# Patient Record
Sex: Female | Born: 1975
Health system: Southern US, Community
[De-identification: ages and names within clinical notes are randomized; demographics above are authoritative.]

## PROBLEM LIST (undated history)

## (undated) ENCOUNTER — Inpatient Hospital Stay (HOSPITAL_COMMUNITY): Payer: Self-pay

## (undated) DIAGNOSIS — L409 Psoriasis, unspecified: Secondary | ICD-10-CM

## (undated) DIAGNOSIS — R87619 Unspecified abnormal cytological findings in specimens from cervix uteri: Secondary | ICD-10-CM

## (undated) DIAGNOSIS — T4145XA Adverse effect of unspecified anesthetic, initial encounter: Secondary | ICD-10-CM

## (undated) DIAGNOSIS — N200 Calculus of kidney: Secondary | ICD-10-CM

## (undated) DIAGNOSIS — N611 Abscess of the breast and nipple: Secondary | ICD-10-CM

## (undated) DIAGNOSIS — T8859XA Other complications of anesthesia, initial encounter: Secondary | ICD-10-CM

## (undated) DIAGNOSIS — B977 Papillomavirus as the cause of diseases classified elsewhere: Secondary | ICD-10-CM

## (undated) DIAGNOSIS — I10 Essential (primary) hypertension: Secondary | ICD-10-CM

## (undated) DIAGNOSIS — IMO0002 Reserved for concepts with insufficient information to code with codable children: Secondary | ICD-10-CM

## (undated) HISTORY — PX: BREAST CYST ASPIRATION: SHX578

## (undated) HISTORY — DX: Essential (primary) hypertension: I10

## (undated) HISTORY — PX: DILATION AND CURETTAGE OF UTERUS: SHX78

## (undated) HISTORY — PX: LEEP: SHX91

## (undated) HISTORY — DX: Abscess of the breast and nipple: N61.1

---

## 1997-03-12 ENCOUNTER — Encounter: Admission: RE | Admit: 1997-03-12 | Discharge: 1997-05-22 | Payer: Self-pay | Admitting: Obstetrics and Gynecology

## 1998-09-16 ENCOUNTER — Other Ambulatory Visit: Admission: RE | Admit: 1998-09-16 | Discharge: 1998-09-16 | Payer: Self-pay | Admitting: Obstetrics and Gynecology

## 1998-10-11 ENCOUNTER — Emergency Department (HOSPITAL_COMMUNITY): Admission: EM | Admit: 1998-10-11 | Discharge: 1998-10-11 | Payer: Self-pay | Admitting: Emergency Medicine

## 1999-08-14 ENCOUNTER — Emergency Department (HOSPITAL_COMMUNITY): Admission: EM | Admit: 1999-08-14 | Discharge: 1999-08-14 | Payer: Self-pay | Admitting: Podiatry

## 1999-09-29 ENCOUNTER — Emergency Department (HOSPITAL_COMMUNITY): Admission: EM | Admit: 1999-09-29 | Discharge: 1999-09-29 | Payer: Self-pay | Admitting: Emergency Medicine

## 1999-11-03 ENCOUNTER — Emergency Department (HOSPITAL_COMMUNITY): Admission: EM | Admit: 1999-11-03 | Discharge: 1999-11-03 | Payer: Self-pay | Admitting: Emergency Medicine

## 2000-03-24 ENCOUNTER — Other Ambulatory Visit: Admission: RE | Admit: 2000-03-24 | Discharge: 2000-03-24 | Payer: Self-pay | Admitting: Obstetrics and Gynecology

## 2001-03-12 HISTORY — PX: LAPAROSCOPY: SHX197

## 2001-03-28 ENCOUNTER — Encounter (INDEPENDENT_AMBULATORY_CARE_PROVIDER_SITE_OTHER): Payer: Self-pay | Admitting: Specialist

## 2001-03-28 ENCOUNTER — Ambulatory Visit (HOSPITAL_COMMUNITY): Admission: RE | Admit: 2001-03-28 | Discharge: 2001-03-28 | Payer: Self-pay | Admitting: Obstetrics and Gynecology

## 2001-04-28 ENCOUNTER — Encounter (INDEPENDENT_AMBULATORY_CARE_PROVIDER_SITE_OTHER): Payer: Self-pay | Admitting: Specialist

## 2001-04-28 ENCOUNTER — Ambulatory Visit (HOSPITAL_COMMUNITY): Admission: RE | Admit: 2001-04-28 | Discharge: 2001-04-28 | Payer: Self-pay | Admitting: Obstetrics and Gynecology

## 2001-06-24 ENCOUNTER — Encounter: Admission: RE | Admit: 2001-06-24 | Discharge: 2001-06-24 | Payer: Self-pay | Admitting: Gastroenterology

## 2001-06-24 ENCOUNTER — Encounter: Payer: Self-pay | Admitting: Gastroenterology

## 2002-11-27 ENCOUNTER — Emergency Department (HOSPITAL_COMMUNITY): Admission: AD | Admit: 2002-11-27 | Discharge: 2002-11-27 | Payer: Self-pay | Admitting: Family Medicine

## 2004-12-22 ENCOUNTER — Emergency Department (HOSPITAL_COMMUNITY): Admission: EM | Admit: 2004-12-22 | Discharge: 2004-12-22 | Payer: Self-pay | Admitting: Family Medicine

## 2005-05-21 ENCOUNTER — Emergency Department (HOSPITAL_COMMUNITY): Admission: EM | Admit: 2005-05-21 | Discharge: 2005-05-21 | Payer: Self-pay | Admitting: Family Medicine

## 2008-02-10 DIAGNOSIS — B977 Papillomavirus as the cause of diseases classified elsewhere: Secondary | ICD-10-CM

## 2008-02-10 HISTORY — PX: LEEP: SHX91

## 2008-02-10 HISTORY — DX: Papillomavirus as the cause of diseases classified elsewhere: B97.7

## 2009-11-04 ENCOUNTER — Encounter: Admission: RE | Admit: 2009-11-04 | Discharge: 2009-11-04 | Payer: Self-pay | Admitting: Obstetrics and Gynecology

## 2010-03-02 ENCOUNTER — Encounter: Payer: Self-pay | Admitting: Obstetrics and Gynecology

## 2010-06-27 NOTE — Op Note (Signed)
[facility]  Patient:    Christina Weber, Christina Weber Visit Number: [id number] MRN: [id number]          Service Type: DSU Location: [facility] Attending Physician:  Osborn Coho Dictated by:   Janeece Riggers Dareen Piano, M.D. Proc. Date: 03/28/01 Admit Date:  03/28/2001                             Operative Report  PREOPERATIVE DIAGNOSES:       1. Menorrhagia.                               2. Dyspareunia.                               3. Dysmenorrhea.                               4. Pelvic pain.  POSTOPERATIVE DIAGNOSES:      1. Menorrhagia.                               2. Dyspareunia.                               3. Dysmenorrhea.                               4. Pelvic pain.  OPERATION:                    1. Dilation and curettage.                               2. Diagnostic laparoscopy.                               3. Lysis of adhesions.  SURGEON:                      Mark E. Dareen Piano, M.D.  ASSISTANT:  ANESTHESIA:                   General endotracheal anesthesia.  ANTIBIOTICS:                  Ancef 1 g.  DRAINS:                       Red rubber catheter to bladder.  ESTIMATED BLOOD LOSS:         Minimal.  COMPLICATIONS:                None.  SPECIMENS:                    Endometrial and endocervical curettings sent                               to pathology.  FINDINGS:  The patient had normal-appearing stomach, liver, and gallbladder.  The patient had no evidence of endometriosis.  The uterus and fallopian tubes were normal. The ovaries were normal bilaterally. There was a follicular cyst on the left ovary. The patient had no evidence of endometriosis or adhesions in the posterior cul-de-sac. Colon appeared to be normal. There were adhesions between the ileum and the right lower quadrant anterior and lateral abdominal wall.  These were taken down with sharp dissection.  DESCRIPTION OF PROCEDURE:     The patient was  taken to the operating room where she was placed in the dorsal supine position.  A general anesthetic was administered without complications.  She was then placed in dorsal lithotomy position after prep with Hibiclens.  Her bladder was drained with a red rubber catheter. She was draped in the usual fashion for this procedure.  A sterile speculum was placed in the vagina.  Single-tooth tenaculum was applied to the anterior cervical lip.  An endocervical curettage was then performed. The uterus was sounded to 8 cm.  Next, endometrial curettage was performed with copious amounts of tissue being obtained.  No abnormalities were palpated during this procedure.  At this point, the Hulka tenaculum was applied to the anterior cervical lip. The umbilicus was then injected with 0.25% Marcaine, a vertical skin incision was made through the previous scar.  The Verres needle was placed in the peritoneal cavity. An adequate pressure reading was not able to be obtained and therefore an open laparoscopy was performed.  At this point, the fascia was grasped with the Allis clamps and the fascia incised with the Mayo scissors.  The parietoperitoneum was entered with blunt dissection. The suture was then placed in the left and right margin and the Hasson cannula placed in the abdominal cavity. Three liters of carbon dioxide was then insufflated. The scope was then placed and examination of the abdominal and pelvic contents undertaken.  Findings are as mentioned above. At this point,  the adhesions in the right lower quadrant were taken down with sharp dissection.  This concluded the procedure. The instruments were then removed.  The pneumoperitoneum released and the fascia closed with 0 Vicryl sutures.  The skin closed with interrupted 4-0 Vicryl suture. The patient tolerated the procedure well.  She was discharged to home.  She was instructed to follow up in the office in four weeks.  The patient was given  Tylox for pain. Dictated by:   Janeece Riggers Dareen Piano, M.D. Attending Physician:  Osborn Coho DD:  03/28/01 TD:  03/28/01 Job: [id number]

## 2010-06-27 NOTE — Op Note (Signed)
[facility]  Patient:    Christina Weber, Christina Weber Visit Number: [id number] MRN: [id number]          Service Type: DSU Location: [facility] Attending Physician:  Osborn Coho Dictated by:   Janeece Riggers Dareen Piano, M.D. Proc. Date: 04/28/01 Admit Date:  04/28/2001 Discharge Date: 04/28/2001                             Operative Report  wPREOPERATIVE DIAGNOSES:       1. Umbilical pain.                               2. Umbilical mass.                               3. Left lower quadrant pain.  POSTOPERATIVE DIAGNOSES:      1. Umbilical pain.                               2. Umbilical mass.                               3. Left lower quadrant pain.  OPERATION:                    1. Exploration of umbilical incision.                               2. Removal of suture granuloma.                               3. Diagnostic laparoscopy.  SURGEON:                      Mark E. Dareen Piano, M.D.  ANESTHESIA:                   General endotracheal.  ANTIBIOTICS:                  Ancef, 1 gm.  COMPLICATIONS:                None.  SPECIMENS:                    Umbilical mass, sent to pathology.  ESTIMATED BLOOD LOSS:         Minimal.  COMPLICATIONS:                None.  DESCRIPTION OF PROCEDURE:     The patient was taken to the operating room where she was placed in the dorsal supine position. A general anesthetic was administered without complications.  She was then placed in the dorsolithotomy position.  Her bladder was drained with a red rubber catheter, and she was draped in the usual fashion.  A vertical umbilical incision was then made. This was carried down to where the mass was palpated.  The mass was approximately 1 x 2 cm.  It was firm.  The mass was excised and appeared to be a suture granuloma.  There was no evidence of any hernia.  There was no evidence of  any bowel stuck to the anterior abdominal wall.  There was no evidence of any  endometriosis.  At this point, sutures were placed laterally with 0 Monocryl suture, and the Hasson cannula was placed into the abdominal cavity.  The abdominal cavity was then insufflated with 3 liters of carbon dioxide.  There was no evidence of any adhesions in the left lower quadrant. The uterus appeared to be normal. Fallopian tubes and ovaries were normal bilaterally.  The liver appeared to be normal. The gallbladder was not visualized.  No etiology for any pelvic pain or left lower quadrant pain could be identified.  It was felt that the patient may have irritable bowel syndrome.  At this point, the camera was removed, the pneumoperitoneum released, and the fascia closed with 2-0 Monocryl in a running fashion.  The subcuticular tissue was closed with interrupted 4-0 Rapide suture and the skin incision closed with interrupted 4-0 Rapide suture.  The patient will be discharged to home.  She will be sent home with Keflex 500 mg q.i.d. for 3 days and told to follow up in the office in four weeks. She will also be sent home with Percocet to take p.r.n. Dictated by:   Janeece Riggers Dareen Piano, M.D. Attending Physician:  Osborn Coho DD:  04/28/01 TD:  05/03/01 Job: [id number]

## 2011-09-25 ENCOUNTER — Ambulatory Visit (INDEPENDENT_AMBULATORY_CARE_PROVIDER_SITE_OTHER): Payer: Managed Care, Other (non HMO) | Admitting: Family Medicine

## 2011-09-25 VITALS — BP 108/71 | HR 68 | Temp 98.1°F | Resp 16 | Ht 62.5 in | Wt 145.0 lb

## 2011-09-25 DIAGNOSIS — Z0000 Encounter for general adult medical examination without abnormal findings: Secondary | ICD-10-CM

## 2011-09-25 NOTE — Progress Notes (Signed)
[facility] [address] [place] [address] [phone]  Date:  09/25/2011   Name:  Christina Weber   DOB:  1975/10/05   MRN:  [id number]  PCP:  No primary provider on file.    Chief Complaint: Annual Exam   History of Present Illness:  Christina Weber is a 36 y.o. very pleasant female patient who presents with the following:  Here today for her annual PE- her GYN care is done by her gynecologist.  Actually, her OBG does all of her labs as well.  She needs to be seen today because her annual health maintenance incentive is coming due at her job.  She is going to get back in shape- she usually runs but has been too busy lately.  She has 3 children, all teens.  She is separated, does not smoke or use drugs and drinks only occasionally.   There is no problem list on file for this patient.   No past medical history on file.  No past surgical history on file.  History  Substance Use Topics  . Smoking status: Former Smoker    Quit date: 09/24/2008  . Smokeless tobacco: Not on file  . Alcohol Use: Not on file    No family history on file.  No Known Allergies  Medication list has been reviewed and updated.  Current Outpatient Prescriptions on File Prior to Visit  Medication Sig Dispense Refill  . Calcium Carbonate-Vitamin D (CALCIUM + D PO) Take by mouth.        Review of Systems:  As per HPI- otherwise negative.   Physical Examination: Filed Vitals:   09/25/11 1508  BP: 108/71  Pulse: 68  Temp: 98.1 F (36.7 C)  Resp: 16   Filed Vitals:   09/25/11 1508  Height: 5' 2.5" (1.588 m)  Weight: 145 lb (65.772 kg)   Body mass index is 26.10 kg/(m^2). Ideal Body Weight: Weight in (lb) to have BMI = 25: 138.6   GEN: WDWN, NAD, Non-toxic, A & O x 3, slightly overweight HEENT: Atraumatic, Normocephalic. Neck supple. No masses, No LAD.  TM and oropharynx wnl, PEERL, EOMI Ears and Nose: No external deformity. CV: RRR, No M/G/R. No JVD. No  thrill. No extra heart sounds. PULM: CTA B, no wheezes, crackles, rhonchi. No retractions. No resp. distress. No accessory muscle use. ABD: S, NT, ND, +BS. No rebound. No HSM. EXTR: No c/c/e NEURO Normal gait.  PSYCH: Normally interactive. Conversant. Not depressed or anxious appearing.  Calm demeanor.    Assessment and Plan: 1. Physical exam, routine    Completed her PE.  She does not need any labs, immunizations or other services today.    Abbe Amsterdam, MD

## 2012-02-10 NOTE — L&D Delivery Note (Signed)
Delivery Note  SVD viable female Apgars 8,9 over intact perineum.  Placenta delivered spontaneously intact with 3VC. Good support and hemostasis noted.  PH art was sent.  [place] cord blood was not available.  Mother and baby were doing well.  EBL 300cc  Candice Camp, MD

## 2012-02-23 ENCOUNTER — Other Ambulatory Visit: Payer: Self-pay | Admitting: Obstetrics and Gynecology

## 2012-02-23 DIAGNOSIS — N6009 Solitary cyst of unspecified breast: Secondary | ICD-10-CM

## 2012-06-29 LAB — OB RESULTS CONSOLE ABO/RH: RH Type: POSITIVE

## 2012-06-29 LAB — OB RESULTS CONSOLE GC/CHLAMYDIA
Chlamydia: NEGATIVE
Gonorrhea: NEGATIVE

## 2012-06-29 LAB — OB RESULTS CONSOLE HIV ANTIBODY (ROUTINE TESTING): HIV: NONREACTIVE

## 2012-06-29 LAB — OB RESULTS CONSOLE ANTIBODY SCREEN: Antibody Screen: NEGATIVE

## 2012-06-29 LAB — OB RESULTS CONSOLE HEPATITIS B SURFACE ANTIGEN: Hepatitis B Surface Ag: NEGATIVE

## 2012-06-29 LAB — OB RESULTS CONSOLE RPR: RPR: NONREACTIVE

## 2013-01-13 ENCOUNTER — Inpatient Hospital Stay (HOSPITAL_COMMUNITY)
Admission: AD | Admit: 2013-01-13 | Discharge: 2013-01-14 | Disposition: A | Discharge status: Home / Self Care | Payer: BC Managed Care – PPO | Source: Ambulatory Visit | Attending: Obstetrics and Gynecology | Admitting: Obstetrics and Gynecology

## 2013-01-13 DIAGNOSIS — O479 False labor, unspecified: Secondary | ICD-10-CM

## 2013-01-13 DIAGNOSIS — Y92009 Unspecified place in unspecified non-institutional (private) residence as the place of occurrence of the external cause: Secondary | ICD-10-CM | POA: Insufficient documentation

## 2013-01-13 DIAGNOSIS — W19XXXA Unspecified fall, initial encounter: Secondary | ICD-10-CM

## 2013-01-13 DIAGNOSIS — W108XXA Fall (on) (from) other stairs and steps, initial encounter: Secondary | ICD-10-CM | POA: Insufficient documentation

## 2013-01-13 HISTORY — DX: Calculus of kidney: N20.0

## 2013-01-14 ENCOUNTER — Encounter (HOSPITAL_COMMUNITY): Payer: Self-pay | Admitting: *Deleted

## 2013-01-14 LAB — AMNISURE RUPTURE OF MEMBRANE (ROM) NOT AT ARMC: Amnisure ROM: NEGATIVE

## 2013-01-14 NOTE — MAU Provider Note (Signed)
History     CSN: [id number]  Arrival date and time: 01/13/13 2349   First Quirino Kakos Initiated Contact with Patient 01/14/13 0101      Chief Complaint  Patient presents with  . Fall  . Contractions   HPI  Ms. Christina Weber is a 37 y.o. female [phone] at [redacted]w[redacted]d who presents to MAU with complaints of contractions and a fall that occurred at 2330 in her home. She was coming down her steps and slipped and fell onto her bottom, scraped her backside as well. She did not land on her abdomen. The contractions started prior to the fall, in fact she has been contracting all day. At the time of the fall she experienced one gush of fluid; she was unsure if she urinated on herself. She did not continue to leak throughout the day.  She reports good fetal movement, denies LOF currently, vaginal bleeding, vaginal itching/burning, urinary symptoms, h/a, dizziness, n/v, or fever/chills.    OB History   Grav Para Term Preterm Abortions TAB SAB Ect Mult Living   6 3 1 2 2 1  1  3       Past Medical History  Diagnosis Date  . Kidney stone     Past Surgical History  Procedure Laterality Date  . Laparoscopy  03/2001    Family History  Problem Relation Age of Onset  . Cancer Mother   . Hypertension Father   . Diabetes Paternal Grandmother   . Hypertension Paternal Grandmother     History  Substance Use Topics  . Smoking status: Former Smoker    Quit date: 09/24/2008  . Smokeless tobacco: Not on file  . Alcohol Use: No    Allergies: No Known Allergies  Prescriptions prior to admission  Medication Sig Dispense Refill  . B Complex Vitamins (B-COMPLEX/B-12 PO) Take by mouth.      . Calcium Carbonate-Vitamin D (CALCIUM + D PO) Take by mouth.      . fish oil-omega-3 fatty acids 1000 MG capsule Take 500 mg by mouth daily.      . Multiple Vitamins-Minerals (MULTIVITAMIN WITH MINERALS) tablet Take 1 tablet by mouth daily.       Results for orders placed during the hospital encounter of  01/13/13 (from the past 24 hour(s))  AMNISURE RUPTURE OF MEMBRANE (ROM)     Status: None   Collection Time    01/14/13 12:35 AM      Result Value Range   Amnisure ROM NEGATIVE      Review of Systems  Constitutional: Negative for fever and chills.  Gastrointestinal: Positive for abdominal pain. Negative for nausea, vomiting, diarrhea and constipation.       + contraction pain   Genitourinary: Negative for dysuria, urgency, frequency and hematuria.       No vaginal discharge. No vaginal bleeding. No dysuria.   Musculoskeletal: Positive for back pain.   Physical Exam   Blood pressure 119/80, pulse 91, temperature 98.4 F (36.9 C), resp. rate 20, height 5' 2.5" (1.588 m), weight 82.918 kg (182 lb 12.8 oz), unknown if currently breastfeeding.  Physical Exam  Constitutional: She is oriented to person, place, and time. She appears well-developed and well-nourished. No distress.  HENT:  Head: Normocephalic.  Eyes: Pupils are equal, round, and reactive to light.  Neck: Neck supple.  Respiratory: Effort normal.  GI: Soft. She exhibits no distension. There is no tenderness. There is no rebound and no guarding.  Abdomen is soft between contractions   Musculoskeletal: Normal  range of motion.  Neurological: She is alert and oriented to person, place, and time.  Skin: Skin is warm. She is not diaphoretic.  Psychiatric: Her behavior is normal.   Dilation: 2.5 Effacement (%): 60 Cervical Position: Posterior Station: -3 Presentation: Vertex Exam by:: D Simpson RN   Cervix rechecked at 0145 Dilation: 2.5 Effacement (%): 60 Cervical Position: Posterior Station: -3 Presentation: Vertex Exam by:: D Simpson RN  Fetal Tracing: Baseline: 135 bpm Variability: Moderate- Marked  Accelerations: 15x15 Decelerations: none  Toco: Irregular contraction pattern    MAU Course  Procedures None  MDM NST Amnisure negative  Consulted with Dr. Vincente Poli regarding fall and plan of care.  Reactive fetal tracing, amnisure negative. Plan to recheck patient and discharge home RN to recheck patients cervix prior to discharge home  Assessment and Plan   A: 1. Fall at home, initial encounter   2. Braxton Hick's contraction     P: Discharge home Discussed warning signs to return for Kick counts discussed Labor precautions discussed Return with any vaginal bleeding or leaking or fluid  RASCH, JENNIFER IRENE NP 01/14/2013, 6:49 AM

## 2013-01-14 NOTE — MAU Note (Signed)
I've been having contractions all day. 2.5cm this am.. Was coming down steps about ago and fell down last 2 steps. Bumped down the 2 steps on bottom and scraped lower back (hardwood). I leaked some afterward and don't think i have leaked any since.

## 2013-01-25 ENCOUNTER — Inpatient Hospital Stay (HOSPITAL_COMMUNITY)
Admission: AD | Admit: 2013-01-25 | Discharge: 2013-01-26 | Disposition: A | Discharge status: Home / Self Care | Payer: BC Managed Care – PPO | Source: Ambulatory Visit | Attending: Obstetrics and Gynecology | Admitting: Obstetrics and Gynecology

## 2013-01-25 DIAGNOSIS — O4690 Antepartum hemorrhage, unspecified, unspecified trimester: Secondary | ICD-10-CM | POA: Insufficient documentation

## 2013-01-25 DIAGNOSIS — O479 False labor, unspecified: Secondary | ICD-10-CM | POA: Insufficient documentation

## 2013-01-25 LAB — OB RESULTS CONSOLE GBS: GBS: POSITIVE

## 2013-01-25 NOTE — MAU Note (Signed)
Contractions every 3-5 mins tonight, getting more painful. Denies LOF. Positive fetal movement. Vaginal bleeding tonight, states started out at spotting but now heavier. Was dilated 4 cm last week.

## 2013-01-25 NOTE — MAU Note (Addendum)
Having ctxs 3-5 min apart, was 4 cm at office, had sm amt of bright red blood earlier.  None on pad at present.

## 2013-01-26 ENCOUNTER — Encounter (HOSPITAL_COMMUNITY): Payer: BC Managed Care – PPO | Admitting: Anesthesiology

## 2013-01-26 ENCOUNTER — Inpatient Hospital Stay (HOSPITAL_COMMUNITY)
Admission: AD | Admit: 2013-01-26 | Discharge: 2013-01-28 | DRG: 775 | Disposition: A | Discharge status: Home / Self Care | Payer: BC Managed Care – PPO | Source: Ambulatory Visit | Attending: Obstetrics and Gynecology | Admitting: Obstetrics and Gynecology

## 2013-01-26 ENCOUNTER — Encounter (HOSPITAL_COMMUNITY): Payer: Self-pay

## 2013-01-26 ENCOUNTER — Encounter (HOSPITAL_COMMUNITY): Payer: Self-pay | Admitting: General Practice

## 2013-01-26 ENCOUNTER — Inpatient Hospital Stay (HOSPITAL_COMMUNITY): Payer: BC Managed Care – PPO | Admitting: Anesthesiology

## 2013-01-26 DIAGNOSIS — Z22330 Carrier of Group B streptococcus: Secondary | ICD-10-CM

## 2013-01-26 DIAGNOSIS — O09529 Supervision of elderly multigravida, unspecified trimester: Secondary | ICD-10-CM | POA: Diagnosis present

## 2013-01-26 DIAGNOSIS — O479 False labor, unspecified: Secondary | ICD-10-CM | POA: Diagnosis present

## 2013-01-26 DIAGNOSIS — Z87891 Personal history of nicotine dependence: Secondary | ICD-10-CM

## 2013-01-26 DIAGNOSIS — O4690 Antepartum hemorrhage, unspecified, unspecified trimester: Secondary | ICD-10-CM | POA: Diagnosis not present

## 2013-01-26 DIAGNOSIS — IMO0001 Reserved for inherently not codable concepts without codable children: Secondary | ICD-10-CM

## 2013-01-26 DIAGNOSIS — O99892 Other specified diseases and conditions complicating childbirth: Principal | ICD-10-CM | POA: Diagnosis present

## 2013-01-26 LAB — CBC
HCT: 38.6 % (ref 36.0–46.0)
Hemoglobin: 13.4 g/dL (ref 12.0–15.0)
MCH: 30 pg (ref 26.0–34.0)
MCHC: 34.7 g/dL (ref 30.0–36.0)
MCV: 86.5 fL (ref 78.0–100.0)
Platelets: 231 10*3/uL (ref 150–400)
RBC: 4.46 MIL/uL (ref 3.87–5.11)
RDW: 13.4 % (ref 11.5–15.5)
WBC: 19.9 10*3/uL — ABNORMAL HIGH (ref 4.0–10.5)

## 2013-01-26 MED ORDER — IBUPROFEN 600 MG PO TABS: 600.0000 mg | ORAL_TABLET | Freq: Four times a day (QID) | ORAL | Status: DC | PRN

## 2013-01-26 MED ORDER — WITCH HAZEL-GLYCERIN EX PADS
1.0000 "application " | MEDICATED_PAD | CUTANEOUS | Status: DC | PRN
  Administered 2013-01-27: 1 via TOPICAL

## 2013-01-26 MED ORDER — PRENATAL MULTIVITAMIN CH
1.0000 | ORAL_TABLET | Freq: Every day | ORAL | Status: DC
Start: 2013-01-27 — End: 2013-01-28
  Administered 2013-01-27 – 2013-01-28 (×2): 1 via ORAL
  Filled 2013-01-26 (×2): qty 1

## 2013-01-26 MED ORDER — SENNOSIDES-DOCUSATE SODIUM 8.6-50 MG PO TABS
2.0000 | ORAL_TABLET | ORAL | Status: DC
  Administered 2013-01-26 – 2013-01-28 (×2): 2 via ORAL
  Filled 2013-01-26 (×2): qty 2

## 2013-01-26 MED ORDER — LACTATED RINGERS IV SOLN
INTRAVENOUS | Status: DC
  Administered 2013-01-26: 19:00:00 via INTRAVENOUS

## 2013-01-26 MED ORDER — DIPHENHYDRAMINE HCL 50 MG/ML IJ SOLN: 12.5000 mg | INTRAMUSCULAR | Status: DC | PRN

## 2013-01-26 MED ORDER — OXYCODONE-ACETAMINOPHEN 5-325 MG PO TABS: 1.0000 | ORAL_TABLET | ORAL | Status: DC | PRN

## 2013-01-26 MED ORDER — OXYTOCIN 40 UNITS IN LACTATED RINGERS INFUSION - SIMPLE MED
62.5000 mL/h | INTRAVENOUS | Status: DC
  Administered 2013-01-26: 62.5 mL/h via INTRAVENOUS
  Filled 2013-01-26: qty 1000

## 2013-01-26 MED ORDER — ACETAMINOPHEN 325 MG PO TABS: 650.0000 mg | ORAL_TABLET | ORAL | Status: DC | PRN

## 2013-01-26 MED ORDER — IBUPROFEN 600 MG PO TABS
600.0000 mg | ORAL_TABLET | Freq: Four times a day (QID) | ORAL | Status: DC
  Administered 2013-01-26 – 2013-01-28 (×6): 600 mg via ORAL
  Filled 2013-01-26 (×7): qty 1

## 2013-01-26 MED ORDER — SIMETHICONE 80 MG PO CHEW: 80.0000 mg | CHEWABLE_TABLET | ORAL | Status: DC | PRN

## 2013-01-26 MED ORDER — EPHEDRINE 5 MG/ML INJ
10.0000 mg | INTRAVENOUS | Status: DC | PRN
  Filled 2013-01-26: qty 2

## 2013-01-26 MED ORDER — DIPHENHYDRAMINE HCL 25 MG PO CAPS: 25.0000 mg | ORAL_CAPSULE | Freq: Four times a day (QID) | ORAL | Status: DC | PRN

## 2013-01-26 MED ORDER — ONDANSETRON HCL 4 MG/2ML IJ SOLN: 4.0000 mg | Freq: Four times a day (QID) | INTRAMUSCULAR | Status: DC | PRN

## 2013-01-26 MED ORDER — MEDROXYPROGESTERONE ACETATE 150 MG/ML IM SUSP: 150.0000 mg | INTRAMUSCULAR | Status: DC | PRN

## 2013-01-26 MED ORDER — BENZOCAINE-MENTHOL 20-0.5 % EX AERO
1.0000 "application " | INHALATION_SPRAY | CUTANEOUS | Status: DC | PRN
  Administered 2013-01-27: 1 via TOPICAL
  Filled 2013-01-26: qty 56

## 2013-01-26 MED ORDER — ONDANSETRON HCL 4 MG/2ML IJ SOLN: 4.0000 mg | INTRAMUSCULAR | Status: DC | PRN

## 2013-01-26 MED ORDER — SODIUM BICARBONATE 8.4 % IV SOLN
INTRAVENOUS | Status: DC | PRN
  Administered 2013-01-26: 5 mL via EPIDURAL

## 2013-01-26 MED ORDER — PHENYLEPHRINE 40 MCG/ML (10ML) SYRINGE FOR IV PUSH (FOR BLOOD PRESSURE SUPPORT)
80.0000 ug | PREFILLED_SYRINGE | INTRAVENOUS | Status: DC | PRN
  Filled 2013-01-26: qty 10
  Filled 2013-01-26: qty 2

## 2013-01-26 MED ORDER — LACTATED RINGERS IV SOLN: 500.0000 mL | Freq: Once | INTRAVENOUS | Status: DC

## 2013-01-26 MED ORDER — MEASLES, MUMPS & RUBELLA VAC ~~LOC~~ INJ: 0.5000 mL | INJECTION | Freq: Once | SUBCUTANEOUS | Status: DC

## 2013-01-26 MED ORDER — EPHEDRINE 5 MG/ML INJ
10.0000 mg | INTRAVENOUS | Status: DC | PRN
  Filled 2013-01-26: qty 4
  Filled 2013-01-26: qty 2

## 2013-01-26 MED ORDER — LIDOCAINE HCL (PF) 1 % IJ SOLN
30.0000 mL | INTRAMUSCULAR | Status: DC | PRN
  Filled 2013-01-26: qty 30

## 2013-01-26 MED ORDER — LANOLIN HYDROUS EX OINT: TOPICAL_OINTMENT | CUTANEOUS | Status: DC | PRN

## 2013-01-26 MED ORDER — LACTATED RINGERS IV SOLN: 500.0000 mL | INTRAVENOUS | Status: DC | PRN

## 2013-01-26 MED ORDER — ZOLPIDEM TARTRATE 5 MG PO TABS: 5.0000 mg | ORAL_TABLET | Freq: Every evening | ORAL | Status: DC | PRN

## 2013-01-26 MED ORDER — PENICILLIN G POTASSIUM 5000000 UNITS IJ SOLR
5.0000 10*6.[IU] | Freq: Once | INTRAVENOUS | Status: DC
  Filled 2013-01-26: qty 5

## 2013-01-26 MED ORDER — OXYCODONE-ACETAMINOPHEN 5-325 MG PO TABS
1.0000 | ORAL_TABLET | ORAL | Status: DC | PRN
  Administered 2013-01-27 (×4): 1 via ORAL
  Administered 2013-01-28: 2 via ORAL
  Filled 2013-01-26 (×3): qty 1
  Filled 2013-01-26: qty 2
  Filled 2013-01-26: qty 1

## 2013-01-26 MED ORDER — ONDANSETRON HCL 4 MG PO TABS: 4.0000 mg | ORAL_TABLET | ORAL | Status: DC | PRN

## 2013-01-26 MED ORDER — BUTORPHANOL TARTRATE 1 MG/ML IJ SOLN
2.0000 mg | Freq: Once | INTRAMUSCULAR | Status: AC
  Administered 2013-01-26: 2 mg via INTRAMUSCULAR
  Filled 2013-01-26: qty 2

## 2013-01-26 MED ORDER — CITRIC ACID-SODIUM CITRATE 334-500 MG/5ML PO SOLN: 30.0000 mL | ORAL | Status: DC | PRN

## 2013-01-26 MED ORDER — TETANUS-DIPHTH-ACELL PERTUSSIS 5-2.5-18.5 LF-MCG/0.5 IM SUSP: 0.5000 mL | Freq: Once | INTRAMUSCULAR | Status: DC

## 2013-01-26 MED ORDER — SODIUM CHLORIDE 0.9 % IV SOLN
2.0000 g | Freq: Four times a day (QID) | INTRAVENOUS | Status: DC
  Filled 2013-01-26 (×2): qty 2000

## 2013-01-26 MED ORDER — FLEET ENEMA 7-19 GM/118ML RE ENEM: 1.0000 | ENEMA | RECTAL | Status: DC | PRN

## 2013-01-26 MED ORDER — DIBUCAINE 1 % RE OINT
1.0000 "application " | TOPICAL_OINTMENT | RECTAL | Status: DC | PRN
  Administered 2013-01-27: 1 via RECTAL
  Filled 2013-01-26: qty 28

## 2013-01-26 MED ORDER — DEXTROSE 5 % IV SOLN
2.5000 10*6.[IU] | INTRAVENOUS | Status: DC
  Filled 2013-01-26 (×3): qty 2.5

## 2013-01-26 MED ORDER — OXYTOCIN BOLUS FROM INFUSION
500.0000 mL | INTRAVENOUS | Status: DC
  Administered 2013-01-26: 500 mL via INTRAVENOUS

## 2013-01-26 MED ORDER — FENTANYL 2.5 MCG/ML BUPIVACAINE 1/10 % EPIDURAL INFUSION (WH - ANES)
14.0000 mL/h | INTRAMUSCULAR | Status: DC | PRN
  Administered 2013-01-26: 14 mL/h via EPIDURAL
  Filled 2013-01-26: qty 125

## 2013-01-26 MED ORDER — PHENYLEPHRINE 40 MCG/ML (10ML) SYRINGE FOR IV PUSH (FOR BLOOD PRESSURE SUPPORT)
80.0000 ug | PREFILLED_SYRINGE | INTRAVENOUS | Status: DC | PRN
  Filled 2013-01-26: qty 2

## 2013-01-26 NOTE — Anesthesia Procedure Notes (Signed)

## 2013-01-26 NOTE — Anesthesia Preprocedure Evaluation (Signed)

## 2013-01-26 NOTE — MAU Note (Signed)
Ctx's 3-6 min apart.  Was here last night- ctx's stopped , had some spotting. No current leaking or bleeding.

## 2013-01-26 NOTE — H&P (Signed)
Christina Weber is a 37 y.o. female presenting for labor eval.  In triage most of the afternoon and now progressed to 5 cm and requesting epidural.  GBS+  Normal materinti21. History OB History   Grav Para Term Preterm Abortions TAB SAB Ect Mult Living   6 3 1 2 2 1  1  3      Past Medical History  Diagnosis Date  . Kidney stone    Past Surgical History  Procedure Laterality Date  . Laparoscopy  03/2001  . Dilation and curettage of uterus     Family History: family history includes Cancer in her mother; Diabetes in her paternal grandmother; Hypertension in her father and paternal grandmother. Social History:  reports that she quit smoking about 8 months ago. She does not have any smokeless tobacco history on file. She reports that she does not drink alcohol or use illicit drugs.   Prenatal Transfer Tool  Maternal Diabetes: No Genetic Screening: Normal Maternal Ultrasounds/Referrals: Normal Fetal Ultrasounds or other Referrals:  None Maternal Substance Abuse:  No Significant Maternal Medications:  None Significant Maternal Lab Results:  None Other Comments:  None  ROS  Dilation: 5 Effacement (%): 100 Station: -1 Exam by:: Lucila Maine, RN Blood pressure 110/61, pulse 81, temperature 97.9 F (36.6 C), temperature source Oral, resp. rate 20, height 5' 1.5" (1.562 m), weight 83.915 kg (185 lb), SpO2 100.00%. Exam Physical Exam  Prenatal labs: ABO, Rh: B/Positive/-- (05/21 0000) Antibody: Negative (05/21 0000) Rubella:   RPR: Nonreactive (05/21 0000)  HBsAg: Negative (05/21 0000)  HIV: Non-reactive (05/21 0000)  GBS: Positive (12/17 0000)   Assessment/Plan: IUP at term in active labor IV abx for GBS AROM and exp management.  Anticipate SVD   Claron Rosencrans C 01/26/2013, 6:22 PM

## 2013-01-27 LAB — CBC
HCT: 34 % — ABNORMAL LOW (ref 36.0–46.0)
Hemoglobin: 11.6 g/dL — ABNORMAL LOW (ref 12.0–15.0)
MCH: 29.6 pg (ref 26.0–34.0)
MCHC: 34.1 g/dL (ref 30.0–36.0)
MCV: 86.7 fL (ref 78.0–100.0)
Platelets: 217 10*3/uL (ref 150–400)
RBC: 3.92 MIL/uL (ref 3.87–5.11)
RDW: 13.6 % (ref 11.5–15.5)
WBC: 17.8 10*3/uL — ABNORMAL HIGH (ref 4.0–10.5)

## 2013-01-27 LAB — RPR: RPR Ser Ql: NONREACTIVE

## 2013-01-27 NOTE — Lactation Note (Signed)
This note was copied from the chart of Christina Okla Qazi. Lactation Consultation Note Comfort gels requested by [facility] RN who states mom has sore nipples, but improved latch.   Patient Name: Christina Weber Date: 01/27/2013     Maternal Data    Feeding Feeding Type: Breast Fed Length of feed: 40 min  LATCH Score/Interventions Latch: Grasps breast easily, tongue down, lips flanged, rhythmical sucking.  Audible Swallowing: Spontaneous and intermittent Intervention(s): Skin to skin  Type of Nipple: Everted at rest and after stimulation  Comfort (Breast/Nipple): Filling, red/small blisters or bruises, mild/mod discomfort  Problem noted: Mild/Moderate discomfort Interventions (Mild/moderate discomfort): Comfort gels  Hold (Positioning): No assistance needed to correctly position infant at breast.  LATCH Score: 9  Lactation Tools Discussed/Used     Consult Status      Christina Weber 01/27/2013, 11:32 PM

## 2013-01-27 NOTE — Anesthesia Postprocedure Evaluation (Signed)
Anesthesia Post Note  Patient: Christina Weber  Procedure(s) Performed: * No procedures listed *  Anesthesia type: Epidural  Patient location: Mother/Baby  Post pain: Pain level controlled  Post assessment: Post-op Vital signs reviewed  Last Vitals:  Filed Vitals:   01/27/13 0243  BP: 103/69  Pulse: 88  Temp: 36.4 C  Resp: 18    Post vital signs: Reviewed  Level of consciousness: awake  Complications: No apparent anesthesia complications

## 2013-01-27 NOTE — Progress Notes (Signed)
Post Partum Day 1 Subjective: no complaints, up ad lib, voiding and tolerating PO  Objective: Blood pressure 103/69, pulse 88, temperature 97.6 F (36.4 C), temperature source Oral, resp. rate 18, height 5' 1.5" (1.562 m), weight 185 lb (83.915 kg), SpO2 83.00%, unknown if currently breastfeeding.  Physical Exam:  General: alert and cooperative Lochia: appropriate Uterine Fundus: firm Incision: perineum intact DVT Evaluation: No evidence of DVT seen on physical exam. Negative Homan's sign. No cords or calf tenderness. No significant calf/ankle edema.   Recent Labs  01/26/13 1652 01/27/13 0625  HGB 13.4 11.6*  HCT 38.6 34.0*    Assessment/Plan: Plan for discharge tomorrow   LOS: 1 day   Derion Kreiter G 01/27/2013, 8:39 AM

## 2013-01-28 MED ORDER — IBUPROFEN 600 MG PO TABS: 600.0000 mg | ORAL_TABLET | Freq: Four times a day (QID) | ORAL | Status: DC

## 2013-01-28 MED ORDER — OXYCODONE-ACETAMINOPHEN 5-325 MG PO TABS: 1.0000 | ORAL_TABLET | ORAL | Status: DC | PRN

## 2013-01-28 NOTE — Discharge Summary (Signed)
Obstetric Discharge Summary Reason for Admission: onset of labor Prenatal Procedures: none Intrapartum Procedures: spontaneous vaginal delivery Postpartum Procedures: none Complications-Operative and Postpartum: none Hemoglobin  Date Value Range Status  01/27/2013 11.6* 12.0 - 15.0 g/dL Final     HCT  Date Value Range Status  01/27/2013 34.0* 36.0 - 46.0 % Final    Physical Exam:  General: alert Lochia: appropriate Uterine Fundus: firm Incision: healing well DVT Evaluation: No evidence of DVT seen on physical exam.  Discharge Diagnoses: Term Pregnancy-delivered  Discharge Information: Date: 01/28/2013 Activity: pelvic rest Diet: routine Medications: PNV, Ibuprofen and Percocet Condition: stable Instructions: refer to practice specific booklet Discharge to: home Follow-up Information   Follow up with [facility], [place].. Schedule an appointment as soon as possible for a visit in 6 weeks.   Contact information:   [address] [place] [address] [phone]      Newborn Data: Live born female  Birth Weight: 6 lb 10.5 oz (3020 g) APGAR: 8, 9  Home with mother.  Meriel Pica 01/28/2013, 12:23 PM

## 2013-01-28 NOTE — Progress Notes (Signed)
CSW received call from hospital staff that FOB was requesting a meal voucher.  CSW met with parents to assess whether there is food in the home and to provide FOB with a meal voucher.  Parents state they have reapplied for food stamps and although there is some food in the home, it is limited at this time.  [facility] was appreciative of the meal voucher and accepting of CSW's offer to assist them with obtaining a one time assistance with food.  CSW will obtain food from a food bank next week and contact FOB to meet CSW at the hospital to pick up the food.  They state no other needs at this time and were extremely appreciative of CSW's intervention. 

## 2013-02-18 ENCOUNTER — Inpatient Hospital Stay (HOSPITAL_COMMUNITY)
Admission: AD | Admit: 2013-02-18 | Discharge: 2013-02-18 | Disposition: A | Discharge status: Home / Self Care | Payer: BC Managed Care – PPO | Source: Ambulatory Visit | Attending: Obstetrics and Gynecology | Admitting: Obstetrics and Gynecology

## 2013-02-18 ENCOUNTER — Encounter (HOSPITAL_COMMUNITY): Payer: Self-pay

## 2013-02-18 DIAGNOSIS — O9122 Nonpurulent mastitis associated with the puerperium: Secondary | ICD-10-CM | POA: Insufficient documentation

## 2013-02-18 DIAGNOSIS — Z87891 Personal history of nicotine dependence: Secondary | ICD-10-CM | POA: Insufficient documentation

## 2013-02-18 DIAGNOSIS — N644 Mastodynia: Secondary | ICD-10-CM | POA: Insufficient documentation

## 2013-02-18 DIAGNOSIS — O9112 Abscess of breast associated with the puerperium: Secondary | ICD-10-CM | POA: Insufficient documentation

## 2013-02-18 DIAGNOSIS — N611 Abscess of the breast and nipple: Secondary | ICD-10-CM

## 2013-02-18 DIAGNOSIS — N61 Mastitis without abscess: Secondary | ICD-10-CM

## 2013-02-18 HISTORY — DX: Papillomavirus as the cause of diseases classified elsewhere: B97.7

## 2013-02-18 HISTORY — DX: Reserved for concepts with insufficient information to code with codable children: IMO0002

## 2013-02-18 HISTORY — DX: Unspecified abnormal cytological findings in specimens from cervix uteri: R87.619

## 2013-02-18 LAB — CBC
HCT: 40.4 % (ref 36.0–46.0)
Hemoglobin: 14 g/dL (ref 12.0–15.0)
MCH: 30 pg (ref 26.0–34.0)
MCHC: 34.7 g/dL (ref 30.0–36.0)
MCV: 86.7 fL (ref 78.0–100.0)
Platelets: 285 10*3/uL (ref 150–400)
RBC: 4.66 MIL/uL (ref 3.87–5.11)
RDW: 12.7 % (ref 11.5–15.5)
WBC: 15.7 10*3/uL — ABNORMAL HIGH (ref 4.0–10.5)

## 2013-02-18 MED ORDER — CEPHALEXIN 500 MG PO CAPS: 500.0000 mg | ORAL_CAPSULE | Freq: Four times a day (QID) | ORAL | Status: DC

## 2013-02-18 MED ORDER — OXYCODONE-ACETAMINOPHEN 5-325 MG PO TABS
2.0000 | ORAL_TABLET | ORAL | Status: AC
  Administered 2013-02-18: 2 via ORAL
  Filled 2013-02-18: qty 2

## 2013-02-18 MED ORDER — CEPHALEXIN 500 MG PO CAPS
500.0000 mg | ORAL_CAPSULE | ORAL | Status: AC
Start: 2013-02-18 — End: 2013-02-18
  Administered 2013-02-18: 500 mg via ORAL
  Filled 2013-02-18: qty 1

## 2013-02-18 MED ORDER — OXYCODONE-ACETAMINOPHEN 5-325 MG PO TABS: 1.0000 | ORAL_TABLET | Freq: Four times a day (QID) | ORAL | Status: DC | PRN

## 2013-02-18 NOTE — Discharge Instructions (Signed)
Breastfeeding and Mastitis Mastitis is inflammation of the breast tissue. It can occur in women who are breastfeeding. This can make breastfeeding painful. Mastitis will sometimes go away on its own. Your health care provider will help determine if treatment is needed. CAUSES Mastitis is often associated with a blocked milk (lactiferous) duct. This can happen when too much milk builds up in the breast. Causes of excess milk in the breast can include:  Poor latch-on. If your baby is not latched onto the breast properly, she or he may not empty your breast completely while breastfeeding.  Allowing too much time to pass between feedings.  Wearing a bra or other clothing that is too tight. This puts extra pressure on the lactiferous ducts so milk does not flow through them as it should. Mastitis can also be caused by a bacterial infection. Bacteria may enter the breast tissue through cuts or openings in the skin. In women who are breastfeeding, this may occur because of cracked or irritated skin. Cracks in the skin are often caused when your baby does not latch on properly to the breast. SIGNS AND SYMPTOMS  Swelling, redness, tenderness, and pain in an area of the breast.  Swelling of the glands under the arm on the same side.  Fever may or may not accompany mastitis. If an infection is allowed to progress, a collection of pus (abscess) may develop. DIAGNOSIS  Your health care provider can usually diagnose mastitis based on your symptoms and a physical exam. Tests may be done to help confirm the diagnosis. These may include:  Removal of pus from the breast by applying pressure to the area. This pus can be examined in the lab to determine which bacteria are present. If an abscess has developed, the fluid in the abscess can be removed with a needle. This can also be used to confirm the diagnosis and determine the bacteria present. In most cases, pus will not be present.  Blood tests to determine if  your body is fighting a bacterial infection.  Mammogram or ultrasound tests to rule out other problems or diseases. TREATMENT  Mastitis that occurs with breastfeeding will sometimes go away on its own. Your health care provider may choose to wait 24 hours after first seeing you to decide whether a prescription medicine is needed. If your symptoms are worse after 24 hours, your health care provider will likely prescribe an antibiotic to treat the mastitis. He or she will determine which bacteria are most likely causing the infection and will then select an appropriate antibiotic. This is sometimes changed based on the results of tests performed to identify the bacteria, or if there is no response to the antibiotic selected. Antibiotics are usually given by mouth. You may also be given medicine for pain. HOME CARE INSTRUCTIONS  Only take over-the-counter or prescription medicines for pain, fever, or discomfort as directed by your health care provider.  If your health care provider prescribed an antibiotic, take the medicine as directed. Make sure you finish it even if you start to feel better.  Do not wear a tight or underwire bra. Wear a soft, supportive bra.  Increase your fluid intake, especially if you have a fever.  Continue to empty the breast. Your health care provider can tell you whether this milk is safe for your infant or needs to be thrown out. You may be told to stop nursing until your health care provider thinks it is safe for your baby. Use a breast pump if  you are advised to stop nursing.  Keep your nipples clean and dry.  Empty the first breast completely before going to the other breast. If your baby is not emptying your breasts completely for some reason, use a breast pump to empty your breasts.  If you go back to work, pump your breasts while at work to stay in time with your nursing schedule.  Avoid allowing your breasts to become overly filled with milk (engorged). SEEK  MEDICAL CARE IF:  You have pus-like discharge from the breast.  Your symptoms do not improve with the treatment prescribed by your health care provider within 2 days. SEEK IMMEDIATE MEDICAL CARE IF:  Your pain and swelling are getting worse.  You have pain that is not controlled with medicine.  You have a red line extending from the breast toward your armpit.  You have a fever or persistent symptoms for more than 2 3 days.  You have a fever and your symptoms suddenly get worse. MAKE SURE YOU:   Understand these instructions.  Will watch your condition.  Will get help right away if you are not doing well or get worse. Document Released: 05/23/2004 Document Revised: 09/28/2012 Document Reviewed: 09/01/2012 [facility] Patient Information 2014 [place], [place].   Abscess An abscess is an infected area that contains a collection of pus and debris.It can occur in almost any part of the body. An abscess is also known as a furuncle or boil. CAUSES  An abscess occurs when tissue gets infected. This can occur from blockage of oil or sweat glands, infection of hair follicles, or a minor injury to the skin. As the body tries to fight the infection, pus collects in the area and creates pressure under the skin. This pressure causes pain. People with weakened immune systems have difficulty fighting infections and get certain abscesses more often.  SYMPTOMS Usually an abscess develops on the skin and becomes a painful mass that is red, warm, and tender. If the abscess forms under the skin, you may feel a moveable soft area under the skin. Some abscesses break open (rupture) on their own, but most will continue to get worse without care. The infection can spread deeper into the body and eventually into the bloodstream, causing you to feel ill.  DIAGNOSIS  Your caregiver will take your medical history and perform a physical exam. A sample of fluid may also be taken from the abscess to determine what is  causing your infection. TREATMENT  Your caregiver may prescribe antibiotic medicines to fight the infection. However, taking antibiotics alone usually does not cure an abscess. Your caregiver may need to make a small cut (incision) in the abscess to drain the pus. In some cases, gauze is packed into the abscess to reduce pain and to continue draining the area. HOME CARE INSTRUCTIONS   Only take over-the-counter or prescription medicines for pain, discomfort, or fever as directed by your caregiver.  If you were prescribed antibiotics, take them as directed. Finish them even if you start to feel better.  If gauze is used, follow your caregiver's directions for changing the gauze.  To avoid spreading the infection:  Keep your draining abscess covered with a bandage.  Wash your hands well.  Do not share personal care items, towels, or whirlpools with others.  Avoid skin contact with others.  Keep your skin and clothes clean around the abscess.  Keep all follow-up appointments as directed by your caregiver. SEEK MEDICAL CARE IF:   You have increased  pain, swelling, redness, fluid drainage, or bleeding.  You have muscle aches, chills, or a general ill feeling.  You have a fever. MAKE SURE YOU:   Understand these instructions.  Will watch your condition.  Will get help right away if you are not doing well or get worse. Document Released: 11/05/2004 Document Revised: 07/28/2011 Document Reviewed: 04/10/2011 [facility] Patient Information 2014 [place].

## 2013-02-18 NOTE — MAU Provider Note (Signed)
Chief Complaint: Breast Pain   First Provider Initiated Contact with Patient 02/18/13 0225     SUBJECTIVE HPI: Christina Weber is a 38 y.o. [id number] presents to maternity admissions 3 weeks following NSVD reporting right breast pain and fever at home.  She reports she has not felt well and has had redness and pain of her right breast x3 days.  Her boyfriend finally made her come in to the hospital today.  She is breastfeeding and has been able to pump some milk from the affected side but has been discarding it.  She is still feeding baby from her left breast.  She reports scant vaginal bleeding, denies vaginal itching/burning, urinary symptoms, h/a, dizziness, or n/v.   Past Medical History  Diagnosis Date  . Kidney stone   . Abnormal Pap smear   . HPV in female 2010   Past Surgical History  Procedure Laterality Date  . Laparoscopy  03/2001  . Dilation and curettage of uterus     History   Social History  . Marital Status: Divorced    Spouse Name: N/A    Number of Children: N/A  . Years of Education: N/A   Occupational History  . Not on file.   Social History Main Topics  . Smoking status: Former Smoker    Quit date: 05/25/2012  . Smokeless tobacco: Not on file  . Alcohol Use: No  . Drug Use: No  . Sexual Activity: Not Currently   Other Topics Concern  . Not on file   Social History Narrative  . No narrative on file   No current facility-administered medications on file prior to encounter.   Current Outpatient Prescriptions on File Prior to Encounter  Medication Sig Dispense Refill  . ibuprofen (ADVIL,MOTRIN) 600 MG tablet Take 1 tablet (600 mg total) by mouth every 6 (six) hours.  30 tablet  1  . Prenatal-FeFum-FA-DSS-Fish Oil (TRICARE PRENATAL DHA ONE) 27-1-500 MG CAPS Take by mouth.       Allergies  Allergen Reactions  . Latex     ROS: Pertinent items in HPI  OBJECTIVE Blood pressure 122/81, pulse 88, temperature 98.9 F (37.2 C), temperature source  Oral, resp. rate 18, height 5' 2.5" (1.588 m), weight 74.662 kg (164 lb 9.6 oz), currently breastfeeding. GENERAL: Well-developed, well-nourished female in no acute distress.  HEENT: Normocephalic HEART: normal rate RESP: normal effort BREAST: right breast firm, warm to touch, with raised hard area of erythema ~5cm in size at 9 o'clock ABDOMEN: Soft, non-tender EXTREMITIES: Nontender, no edema NEURO: Alert and oriented   LAB RESULTS Results for orders placed during the hospital encounter of 02/18/13 (from the past 24 hour(s))  CBC     Status: Abnormal   Collection Time    02/18/13  3:50 AM      Result Value Range   WBC 15.7 (*) 4.0 - 10.5 K/uL   RBC 4.66  3.87 - 5.11 MIL/uL   Hemoglobin 14.0  12.0 - 15.0 g/dL   HCT 40.4  36.0 - 46.0 %   MCV 86.7  78.0 - 100.0 fL   MCH 30.0  26.0 - 34.0 pg   MCHC 34.7  30.0 - 36.0 g/dL   RDW 12.7  11.5 - 15.5 %   Platelets 285  150 - 400 K/uL    ASSESSMENT 1. Acute mastitis of right breast   2. Abscess of breast, right     PLAN Consult Dr Julien Girt Discharge home Keflex 500 mg QID x 10 days, first  dose in MAU along with Percocet 5/325 x2 tabs Percocet 5/325, take 1-2 tabs Q 6 hours PRN x20 tabs Call office on Monday to f/u Teaching done about mastitis management, including breast massage, warm compress, continuing to breastfeed/pump, improved infant latch, etc.   Return to MAU as needed    Medication List         cephALEXin 500 MG capsule  Commonly known as:  KEFLEX  Take 1 capsule (500 mg total) by mouth 4 (four) times daily.     ibuprofen 600 MG tablet  Commonly known as:  ADVIL,MOTRIN  Take 1 tablet (600 mg total) by mouth every 6 (six) hours.     oxyCODONE-acetaminophen 5-325 MG per tablet  Commonly known as:  PERCOCET/ROXICET  Take 1-2 tablets by mouth every 6 (six) hours as needed for severe pain (moderate - severe pain).     TRICARE PRENATAL DHA ONE 27-1-500 MG Caps  Take by mouth.       Follow-up Information   Call  ADKINS,GRETCHEN, MD. (Call the office on Monday to follow up.  )    Specialty:  Obstetrics and Gynecology   Contact information:   [place], [place] [address] [phone]       Fatima Blank Certified Nurse-Midwife 02/18/2013  5:00 AM

## 2013-02-18 NOTE — MAU Note (Addendum)
Right breast pain since Sunday. Started with a "knot" in her breast that she thought was due to engorgement, pain has worsened; right breast red, warm & tight. Highest temp was 101, but has gone down since then. Denies nipple discharge. Denies cough, sore throat, or cold symptoms. SVD on 01/26/13. Has been breastfeeding, only giving baby milk from left side since pain started but has been pumping on right side.

## 2013-02-27 ENCOUNTER — Ambulatory Visit
Admission: RE | Admit: 2013-02-27 | Discharge: 2013-02-27 | Disposition: A | Discharge status: Home / Self Care | Payer: BC Managed Care – PPO | Source: Ambulatory Visit | Attending: Obstetrics & Gynecology | Admitting: Obstetrics & Gynecology

## 2013-02-27 ENCOUNTER — Other Ambulatory Visit: Payer: Self-pay | Admitting: Obstetrics & Gynecology

## 2013-02-27 DIAGNOSIS — N611 Abscess of the breast and nipple: Secondary | ICD-10-CM

## 2013-02-27 DIAGNOSIS — N6009 Solitary cyst of unspecified breast: Secondary | ICD-10-CM

## 2013-02-28 ENCOUNTER — Other Ambulatory Visit: Payer: Self-pay

## 2013-02-28 ENCOUNTER — Ambulatory Visit (INDEPENDENT_AMBULATORY_CARE_PROVIDER_SITE_OTHER): Payer: BC Managed Care – PPO | Admitting: General Surgery

## 2013-02-28 ENCOUNTER — Encounter (INDEPENDENT_AMBULATORY_CARE_PROVIDER_SITE_OTHER): Payer: Self-pay | Admitting: General Surgery

## 2013-02-28 VITALS — BP 114/72 | HR 80 | Temp 97.1°F | Resp 16 | Ht 62.5 in | Wt 166.8 lb

## 2013-02-28 DIAGNOSIS — N61 Mastitis without abscess: Secondary | ICD-10-CM

## 2013-02-28 DIAGNOSIS — N611 Abscess of the breast and nipple: Secondary | ICD-10-CM | POA: Insufficient documentation

## 2013-02-28 NOTE — Patient Instructions (Signed)
Get the refill on the antibiotics and continue to take them. Shower daily and wash the area with dilute soap.

## 2013-02-28 NOTE — Progress Notes (Signed)
Patient ID: Christina Weber, female   DOB: 09-05-1975, 38 y.o.   MRN: [id number]  Chief Complaint  Patient presents with  . Cyst    Breast abscess    HPI Christina Weber is a 38 y.o. female.   HPI  She is 5 weeks postpartum. About 2 and half weeks ago she developed some swelling and redness at the 3:00 position of the right breast. She had been breast feeding. When the area is opened up and drained some. She was placed on antibiotics (Keflex).  Despite that, the infection continued to spread. She subsequently was changed over to clindamycin. She went to the breast imaging center yesterday and had 14 cc of purulent fluid aspirated from the right breast. She had an appointment to see [place] as well and presents for that today. She had been running some low-grade fever but is afebrile in the office today.  Past Medical History  Diagnosis Date  . Kidney stone   . Abnormal Pap smear   . HPV in female 2010    Past Surgical History  Procedure Laterality Date  . Laparoscopy  03/2001  . Dilation and curettage of uterus      Family History  Problem Relation Age of Onset  . Cancer Mother   . Hypertension Father   . Diabetes Paternal Grandmother   . Hypertension Paternal Grandmother     Social History History  Substance Use Topics  . Smoking status: Former Smoker    Quit date: 05/25/2012  . Smokeless tobacco: Not on file  . Alcohol Use: No    Allergies  Allergen Reactions  . Latex     Current Outpatient Prescriptions  Medication Sig Dispense Refill  . CLINDAMYCIN HCL PO Take by mouth.      . Prenatal-FeFum-FA-DSS-Fish Oil (TRICARE PRENATAL DHA ONE) 27-1-500 MG CAPS Take by mouth.      Marland Kitchen ibuprofen (ADVIL,MOTRIN) 600 MG tablet Take 1 tablet (600 mg total) by mouth every 6 (six) hours.  30 tablet  1  . oxyCODONE-acetaminophen (PERCOCET/ROXICET) 5-325 MG per tablet Take 1-2 tablets by mouth every 6 (six) hours as needed for severe pain (moderate - severe pain).  20 tablet  0   No  current facility-administered medications for this visit.    Review of Systems Review of Systems  Constitutional: Negative for fever and chills.  Respiratory:       Right breast pain    Blood pressure 114/72, pulse 80, temperature 97.1 F (36.2 C), temperature source Temporal, resp. rate 16, height 5' 2.5" (1.588 m), weight 166 lb 12.8 oz (75.66 kg).  Physical Exam Physical Exam  Constitutional: She appears well-developed and well-nourished. No distress.  Pulmonary/Chest:  There is a superficial open wound 9:00 position at the nipple areolar complex area. There are small puncture wounds at the 5:00 and 12:00 position. There is some redness and induration in the lateral aspect of the breast near the nipple areolar complex.    Data Reviewed Ultrasound-guided breast abscess aspiration note  Assessment    Right breast abscess status post ultrasound-guided aspiration.     Plan    Continue the clindamycin. Return visit in one week. I told her if it did not respond to current treatment, she would need incision and drainage in the operating room.        Kathern Lobosco J 02/28/2013, 4:41 PM

## 2013-03-02 ENCOUNTER — Other Ambulatory Visit (INDEPENDENT_AMBULATORY_CARE_PROVIDER_SITE_OTHER): Payer: Self-pay | Admitting: *Deleted

## 2013-03-02 MED ORDER — CLINDAMYCIN HCL 150 MG PO CAPS: 150.0000 mg | ORAL_CAPSULE | Freq: Three times a day (TID) | ORAL | Status: DC

## 2013-03-08 ENCOUNTER — Ambulatory Visit (INDEPENDENT_AMBULATORY_CARE_PROVIDER_SITE_OTHER): Payer: BC Managed Care – PPO | Admitting: General Surgery

## 2013-03-08 ENCOUNTER — Encounter (INDEPENDENT_AMBULATORY_CARE_PROVIDER_SITE_OTHER): Payer: Self-pay | Admitting: General Surgery

## 2013-03-08 VITALS — BP 120/84 | HR 92 | Temp 97.2°F | Resp 14 | Ht 62.5 in | Wt 164.4 lb

## 2013-03-08 DIAGNOSIS — N611 Abscess of the breast and nipple: Secondary | ICD-10-CM

## 2013-03-08 DIAGNOSIS — N61 Mastitis without abscess: Secondary | ICD-10-CM

## 2013-03-08 MED ORDER — CLINDAMYCIN HCL 150 MG PO CAPS: 300.0000 mg | ORAL_CAPSULE | Freq: Three times a day (TID) | ORAL | Status: DC

## 2013-03-08 NOTE — Progress Notes (Signed)
Subjective:     Patient ID: Christina Weber, female   DOB: 1975/09/03, 38 y.o.   MRN: [id number]  HPI  She is here for followup of her right breast abscess. She underwent ultrasound-guided aspiration. She is on clindamycin. She feels she is doing much better. She is using her breast pump on the right side and not getting much out.   Review of Systems     Objective:   Physical Exam Generally-she looks well and is in no acute distress.  Right breast-significantly decreased erythema. Still some induration laterally. No fluctuant areas.    Assessment:     Right breast cellulitis and abscess-improving with current treatment.     Plan:     Continue clindamycin. Return visit in 2 weeks.

## 2013-03-08 NOTE — Patient Instructions (Signed)
You have a right breast abscess. The treatment thus far has been needle aspiration of the infection and antibiotics. We'll continue the antibiotics for now.

## 2013-03-16 ENCOUNTER — Telehealth (INDEPENDENT_AMBULATORY_CARE_PROVIDER_SITE_OTHER): Payer: Self-pay | Admitting: General Surgery

## 2013-03-16 NOTE — Telephone Encounter (Signed)
Pt was instructed to continue antibiotics until seen again (appt 03/22/13.) She is out now, so asking for refill.  Still has knots in her breast.  Verified same pharmacy as in demographics.  Please advise.

## 2013-03-16 NOTE — Telephone Encounter (Signed)
Refill same antibiotic.

## 2013-03-17 ENCOUNTER — Telehealth (INDEPENDENT_AMBULATORY_CARE_PROVIDER_SITE_OTHER): Payer: Self-pay

## 2013-03-17 ENCOUNTER — Other Ambulatory Visit (INDEPENDENT_AMBULATORY_CARE_PROVIDER_SITE_OTHER): Payer: Self-pay

## 2013-03-17 MED ORDER — CLINDAMYCIN HCL 150 MG PO CAPS: 300.0000 mg | ORAL_CAPSULE | Freq: Three times a day (TID) | ORAL | Status: DC

## 2013-03-17 NOTE — Telephone Encounter (Signed)
Refill of antibiotic sent to pt's pharmacy.

## 2013-03-22 ENCOUNTER — Ambulatory Visit (INDEPENDENT_AMBULATORY_CARE_PROVIDER_SITE_OTHER): Payer: BC Managed Care – PPO | Admitting: General Surgery

## 2013-03-22 VITALS — BP 124/64 | HR 65 | Temp 98.0°F | Resp 18 | Ht 62.5 in | Wt 165.0 lb

## 2013-03-22 DIAGNOSIS — N61 Mastitis without abscess: Secondary | ICD-10-CM

## 2013-03-22 DIAGNOSIS — N611 Abscess of the breast and nipple: Secondary | ICD-10-CM

## 2013-03-22 MED ORDER — DOXYCYCLINE HYCLATE 100 MG PO TABS: 100.0000 mg | ORAL_TABLET | Freq: Two times a day (BID) | ORAL | Status: DC

## 2013-03-22 NOTE — Patient Instructions (Signed)
Do not feed the baby any breast milk while you are taking the doxycycline.

## 2013-03-22 NOTE — Progress Notes (Signed)
Subjective:     Patient ID: Christina Weber, female   DOB: 1975-06-23, 38 y.o.   MRN: [id number]  HPI  She is here for followup of her right breast abscess. She remains on clindamycin. She does not feel that the situation is getting much better. She has noted some knots in the area where the erythema and induration are.  Review of Systems     Objective:   Physical Exam Generally-she looks well and is in no acute distress.  Right breast-There is some low-grade persistent erythema and induration and 2 firm nodules from the 9:00 to the 12:00 position. No tenderness. No nipple discharge.    Assessment:     Right breast cellulitis and abscess-There has been no further improvement.   Plan:     I discussed incision and drainage as well as biopsy and debridement with her. She would like to try a different antibiotic to see if she can avoid surgery. Thus, we'll put her on doxycycline for one week and see her back at that time. I told her that she cannot feed the baby any of her breast milk during that time.

## 2013-03-29 ENCOUNTER — Other Ambulatory Visit (INDEPENDENT_AMBULATORY_CARE_PROVIDER_SITE_OTHER): Payer: Self-pay | Admitting: General Surgery

## 2013-03-29 ENCOUNTER — Ambulatory Visit (INDEPENDENT_AMBULATORY_CARE_PROVIDER_SITE_OTHER): Payer: BC Managed Care – PPO | Admitting: General Surgery

## 2013-03-29 ENCOUNTER — Encounter (INDEPENDENT_AMBULATORY_CARE_PROVIDER_SITE_OTHER): Payer: Self-pay | Admitting: General Surgery

## 2013-03-29 VITALS — BP 118/74 | HR 72 | Temp 98.6°F | Resp 16 | Ht 62.5 in | Wt 164.0 lb

## 2013-03-29 DIAGNOSIS — N63 Unspecified lump in unspecified breast: Secondary | ICD-10-CM

## 2013-03-29 DIAGNOSIS — N611 Abscess of the breast and nipple: Secondary | ICD-10-CM

## 2013-03-29 DIAGNOSIS — N61 Mastitis without abscess: Secondary | ICD-10-CM

## 2013-03-29 MED ORDER — DOXYCYCLINE HYCLATE 100 MG PO TABS: 100.0000 mg | ORAL_TABLET | Freq: Two times a day (BID) | ORAL | Status: DC

## 2013-03-29 NOTE — Progress Notes (Signed)
Subjective:     Patient ID: Christina Weber, female   DOB: Christina 08, 1977, 38 y.o.   MRN: [id number]  HPI  She is here for followup of her right breast abscess. Since we switched her to doxycycline, the swelling and redness has improved significantly. The nodules persist.  She has been called by the breast center for followup imaging.  Review of Systems     Objective:   Physical Exam Generally-she looks well and is in no acute distress.  Right breast-The erythema and induration is almost completely resolved. In the upper outer quadrant, there were 2 palpable nodules with some skin indentation present.    Assessment:     Right breast cellulitis and abscess-There has been significant improvement since starting the doxycycline. However, 2 nodules with skin indentation persist. This is concerning for possible inflammatory neoplasm.  Limited right breast ultrasound demonstrates some areas of ductal ectasia but no obvious pockets amenable to drainage. Plan:     Continue doxycycline. We'll get her back to the breast center for followup imaging studies and possible biopsy of the 2 upper Outer quadrant nodules.  Return visit in 2 weeks.

## 2013-03-29 NOTE — Patient Instructions (Signed)
Continue to avoid breast-feeding. Will set you up for repeat imaging studies on the right breast.

## 2013-04-10 ENCOUNTER — Other Ambulatory Visit: Payer: Medicaid Other

## 2013-04-14 ENCOUNTER — Encounter (INDEPENDENT_AMBULATORY_CARE_PROVIDER_SITE_OTHER): Payer: BC Managed Care – PPO | Admitting: General Surgery

## 2013-04-18 ENCOUNTER — Ambulatory Visit
Admission: RE | Admit: 2013-04-18 | Discharge: 2013-04-18 | Disposition: A | Discharge status: Home / Self Care | Payer: Medicaid Other | Source: Ambulatory Visit | Attending: General Surgery | Admitting: General Surgery

## 2013-04-18 ENCOUNTER — Ambulatory Visit
Admission: RE | Admit: 2013-04-18 | Discharge: 2013-04-18 | Disposition: A | Discharge status: Home / Self Care | Payer: BC Managed Care – PPO | Source: Ambulatory Visit | Attending: General Surgery | Admitting: General Surgery

## 2013-04-18 ENCOUNTER — Other Ambulatory Visit (INDEPENDENT_AMBULATORY_CARE_PROVIDER_SITE_OTHER): Payer: Self-pay | Admitting: General Surgery

## 2013-04-18 DIAGNOSIS — N63 Unspecified lump in unspecified breast: Secondary | ICD-10-CM

## 2013-04-20 ENCOUNTER — Encounter (INDEPENDENT_AMBULATORY_CARE_PROVIDER_SITE_OTHER): Payer: Self-pay | Admitting: General Surgery

## 2013-04-20 ENCOUNTER — Ambulatory Visit (INDEPENDENT_AMBULATORY_CARE_PROVIDER_SITE_OTHER): Payer: BC Managed Care – PPO | Admitting: General Surgery

## 2013-04-20 VITALS — BP 130/80 | HR 72 | Resp 14 | Ht 62.5 in | Wt 162.0 lb

## 2013-04-20 DIAGNOSIS — N61 Mastitis without abscess: Secondary | ICD-10-CM

## 2013-04-20 DIAGNOSIS — N611 Abscess of the breast and nipple: Secondary | ICD-10-CM

## 2013-04-20 NOTE — Progress Notes (Signed)
Subjective:     Patient ID: Christina Weber, female   DOB: 04-04-1975, 38 y.o.   MRN: [id number]  HPI  She is here for followup of her right breast abscess. She feels much better overall. Imaging studies demonstrated some findings consistent with an inflammatory process but no evidence of any drainable abscess or malignancy. She is finished with her antibiotics. Review of Systems     Objective:   Physical Exam Generally-she looks well and is in no acute distress.  Right breast-The erythema and induration is minimal.  The 2 palpable nodules are nearly resolved.   Assessment:     Right breast cellulitis and abscess-This has improved. No evidence of malignancy.   Plan:     No further evaluation needed. I asked her to call back if she had a recurrent infection. At that time, I believe she would need surgery.

## 2013-04-20 NOTE — Patient Instructions (Signed)
If the infection returns, please call and make an appointment.

## 2013-12-11 ENCOUNTER — Encounter (INDEPENDENT_AMBULATORY_CARE_PROVIDER_SITE_OTHER): Payer: Self-pay | Admitting: General Surgery

## 2014-04-04 ENCOUNTER — Encounter (HOSPITAL_COMMUNITY): Payer: Self-pay

## 2014-04-04 NOTE — H&P (Signed)
Christina Weber is an 39 y.o. female. Seen in office yesterday. Sonogram revealed nonviable first trimester pregnancy comes in for dilation and evacuation.  Pertinent Gynecological History: Menses: flow is moderate Bleeding: pregnant Contraception: none DES exposure: denies Blood transfusions: none Sexually transmitted diseases: no past history Previous GYN Procedures: DNC  Last mammogram: na Date: na  Last pap: normal Date: 46  OB History: G7, P4   Menstrual History: Menarche age: 40  No LMP recorded.    Past Medical History  Diagnosis Date  . Kidney stone   . Abnormal Pap smear   . HPV in female 2010  . Breast abscess     right breast    Past Surgical History  Procedure Laterality Date  . Laparoscopy  03/2001  . Dilation and curettage of uterus      Family History  Problem Relation Age of Onset  . Cancer Mother     brain  . Hypertension Father   . Diabetes Paternal Grandmother   . Hypertension Paternal Grandmother     Social History:  reports that she quit smoking about 22 months ago. She does not have any smokeless tobacco history on file. She reports that she does not drink alcohol or use illicit drugs.  Allergies:  Allergies  Allergen Reactions  . Latex     No prescriptions prior to admission    Review of Systems  All other systems reviewed and are negative.   There were no vitals taken for this visit. Physical Exam  Constitutional: She is oriented to person, place, and time. She appears well-developed and well-nourished.  Eyes: Pupils are equal, round, and reactive to light.  Cardiovascular: Normal rate, regular rhythm and normal heart sounds.   Respiratory: Effort normal and breath sounds normal.  GI: Soft. Bowel sounds are normal.  Genitourinary:  Uterus 8 week  Musculoskeletal: Normal range of motion.  Neurological: She is alert and oriented to person, place, and time. She has normal reflexes.    No results found for this or any  previous visit (from the past 24 hour(s)).  No results found.  Assessment/Plan: Nonviable first trimester pregnancy For dilation and evacuation.  Risks include:  Infection. Hemorrhage that could require transfusion with risk of AID's or hepatitis.  Excessive bleeding could require hysterectomy.  Uterine perforation with injury to adjacent organs that could require exploratory surgery.  Risk of DVT and PE.  Blood type B+  Tevin Shillingford S 04/04/2014, 3:02 PM

## 2014-04-05 ENCOUNTER — Ambulatory Visit (HOSPITAL_COMMUNITY): Payer: BLUE CROSS/BLUE SHIELD | Admitting: Anesthesiology

## 2014-04-05 ENCOUNTER — Encounter (HOSPITAL_COMMUNITY)
Admission: RE | Disposition: A | Discharge status: Home / Self Care | Payer: Self-pay | Source: Ambulatory Visit | Attending: Obstetrics and Gynecology

## 2014-04-05 ENCOUNTER — Encounter (HOSPITAL_COMMUNITY): Payer: Self-pay | Admitting: Anesthesiology

## 2014-04-05 ENCOUNTER — Ambulatory Visit (HOSPITAL_COMMUNITY)
Admission: RE | Admit: 2014-04-05 | Discharge: 2014-04-05 | Disposition: A | Discharge status: Home / Self Care | Payer: BLUE CROSS/BLUE SHIELD | Source: Ambulatory Visit | Attending: Obstetrics and Gynecology | Admitting: Obstetrics and Gynecology

## 2014-04-05 DIAGNOSIS — Z87891 Personal history of nicotine dependence: Secondary | ICD-10-CM | POA: Diagnosis not present

## 2014-04-05 DIAGNOSIS — O034 Incomplete spontaneous abortion without complication: Secondary | ICD-10-CM | POA: Diagnosis present

## 2014-04-05 DIAGNOSIS — O021 Missed abortion: Secondary | ICD-10-CM | POA: Diagnosis not present

## 2014-04-05 DIAGNOSIS — Z91040 Latex allergy status: Secondary | ICD-10-CM | POA: Diagnosis not present

## 2014-04-05 HISTORY — DX: Other complications of anesthesia, initial encounter: T88.59XA

## 2014-04-05 HISTORY — PX: DILATION AND EVACUATION: SHX1459

## 2014-04-05 HISTORY — DX: Adverse effect of unspecified anesthetic, initial encounter: T41.45XA

## 2014-04-05 LAB — CBC
HCT: 38.5 % (ref 36.0–46.0)
Hemoglobin: 13.1 g/dL (ref 12.0–15.0)
MCH: 29.7 pg (ref 26.0–34.0)
MCHC: 34 g/dL (ref 30.0–36.0)
MCV: 87.3 fL (ref 78.0–100.0)
Platelets: 249 10*3/uL (ref 150–400)
RBC: 4.41 MIL/uL (ref 3.87–5.11)
RDW: 13.5 % (ref 11.5–15.5)
WBC: 9.5 10*3/uL (ref 4.0–10.5)

## 2014-04-05 SURGERY — DILATION AND EVACUATION, UTERUS
Anesthesia: Monitor Anesthesia Care | Site: Vagina

## 2014-04-05 MED ORDER — ONDANSETRON HCL 4 MG/2ML IJ SOLN
INTRAMUSCULAR | Status: AC
  Filled 2014-04-05: qty 2

## 2014-04-05 MED ORDER — ONDANSETRON HCL 4 MG/2ML IJ SOLN
INTRAMUSCULAR | Status: AC
  Administered 2014-04-05: 4 mg via INTRAVENOUS
  Filled 2014-04-05: qty 2

## 2014-04-05 MED ORDER — FENTANYL CITRATE 0.05 MG/ML IJ SOLN: 25.0000 ug | INTRAMUSCULAR | Status: DC | PRN

## 2014-04-05 MED ORDER — DEXAMETHASONE SODIUM PHOSPHATE 10 MG/ML IJ SOLN
INTRAMUSCULAR | Status: DC | PRN
  Administered 2014-04-05: 4 mg via INTRAVENOUS

## 2014-04-05 MED ORDER — CEFAZOLIN SODIUM-DEXTROSE 2-3 GM-% IV SOLR
INTRAVENOUS | Status: AC
  Filled 2014-04-05: qty 50

## 2014-04-05 MED ORDER — CHLOROPROCAINE HCL 1 % IJ SOLN
INTRAMUSCULAR | Status: AC
  Filled 2014-04-05: qty 30

## 2014-04-05 MED ORDER — GLYCOPYRROLATE 0.2 MG/ML IJ SOLN
INTRAMUSCULAR | Status: DC | PRN
  Administered 2014-04-05: 0.1 mg via INTRAVENOUS

## 2014-04-05 MED ORDER — PROPOFOL 10 MG/ML IV BOLUS
INTRAVENOUS | Status: AC
  Filled 2014-04-05: qty 40

## 2014-04-05 MED ORDER — OXYCODONE-ACETAMINOPHEN 7.5-325 MG PO TABS: 1.0000 | ORAL_TABLET | ORAL | Status: DC | PRN

## 2014-04-05 MED ORDER — CHLOROPROCAINE HCL 1 % IJ SOLN
INTRAMUSCULAR | Status: DC | PRN
  Administered 2014-04-05: 20 mL

## 2014-04-05 MED ORDER — FENTANYL CITRATE 0.05 MG/ML IJ SOLN
INTRAMUSCULAR | Status: AC
  Filled 2014-04-05: qty 2

## 2014-04-05 MED ORDER — MIDAZOLAM HCL 2 MG/2ML IJ SOLN
INTRAMUSCULAR | Status: DC | PRN
  Administered 2014-04-05: 2 mg via INTRAVENOUS

## 2014-04-05 MED ORDER — PROPOFOL 10 MG/ML IV BOLUS
INTRAVENOUS | Status: DC | PRN
  Administered 2014-04-05: 30 mg via INTRAVENOUS

## 2014-04-05 MED ORDER — LACTATED RINGERS IV SOLN
INTRAVENOUS | Status: DC
  Administered 2014-04-05: 12:00:00 via INTRAVENOUS

## 2014-04-05 MED ORDER — PROPOFOL INFUSION 10 MG/ML OPTIME
INTRAVENOUS | Status: DC | PRN
  Administered 2014-04-05: 100 ug/kg/min via INTRAVENOUS

## 2014-04-05 MED ORDER — CEFAZOLIN SODIUM-DEXTROSE 2-3 GM-% IV SOLR
2.0000 g | INTRAVENOUS | Status: AC
  Administered 2014-04-05: 2 g via INTRAVENOUS

## 2014-04-05 MED ORDER — KETOROLAC TROMETHAMINE 30 MG/ML IJ SOLN
INTRAMUSCULAR | Status: DC | PRN
  Administered 2014-04-05: 30 mg via INTRAVENOUS

## 2014-04-05 MED ORDER — KETOROLAC TROMETHAMINE 30 MG/ML IJ SOLN: 30.0000 mg | Freq: Once | INTRAMUSCULAR | Status: DC | PRN

## 2014-04-05 MED ORDER — MEPERIDINE HCL 25 MG/ML IJ SOLN: 6.2500 mg | INTRAMUSCULAR | Status: DC | PRN

## 2014-04-05 MED ORDER — DEXAMETHASONE SODIUM PHOSPHATE 10 MG/ML IJ SOLN
INTRAMUSCULAR | Status: AC
  Filled 2014-04-05: qty 1

## 2014-04-05 MED ORDER — MIDAZOLAM HCL 2 MG/2ML IJ SOLN: 0.5000 mg | Freq: Once | INTRAMUSCULAR | Status: DC | PRN

## 2014-04-05 MED ORDER — ACETAMINOPHEN 325 MG PO TABS: 325.0000 mg | ORAL_TABLET | ORAL | Status: DC | PRN

## 2014-04-05 MED ORDER — OXYCODONE-ACETAMINOPHEN 5-325 MG PO TABS
1.0000 | ORAL_TABLET | ORAL | Status: DC | PRN
  Administered 2014-04-05: 1 via ORAL

## 2014-04-05 MED ORDER — ONDANSETRON HCL 4 MG/2ML IJ SOLN: 4.0000 mg | Freq: Once | INTRAMUSCULAR | Status: DC

## 2014-04-05 MED ORDER — MIDAZOLAM HCL 2 MG/2ML IJ SOLN
INTRAMUSCULAR | Status: AC
  Filled 2014-04-05: qty 2

## 2014-04-05 MED ORDER — LIDOCAINE HCL (CARDIAC) 20 MG/ML IV SOLN
INTRAVENOUS | Status: AC
  Filled 2014-04-05: qty 5

## 2014-04-05 MED ORDER — PROMETHAZINE HCL 25 MG/ML IJ SOLN: 6.2500 mg | INTRAMUSCULAR | Status: DC | PRN

## 2014-04-05 MED ORDER — LIDOCAINE HCL (CARDIAC) 20 MG/ML IV SOLN
INTRAVENOUS | Status: DC | PRN
  Administered 2014-04-05: 60 mg via INTRAVENOUS

## 2014-04-05 MED ORDER — ONDANSETRON HCL 4 MG/2ML IJ SOLN
INTRAMUSCULAR | Status: DC | PRN
Start: 2014-04-05 — End: 2014-04-05
  Administered 2014-04-05: 4 mg via INTRAVENOUS

## 2014-04-05 MED ORDER — ACETAMINOPHEN 160 MG/5ML PO SOLN: 325.0000 mg | ORAL | Status: DC | PRN

## 2014-04-05 MED ORDER — ONDANSETRON HCL 4 MG/2ML IJ SOLN
4.0000 mg | Freq: Once | INTRAMUSCULAR | Status: AC
  Administered 2014-04-05: 4 mg via INTRAVENOUS

## 2014-04-05 MED ORDER — FENTANYL CITRATE 0.05 MG/ML IJ SOLN
INTRAMUSCULAR | Status: DC | PRN
  Administered 2014-04-05: 100 ug via INTRAVENOUS
  Administered 2014-04-05 (×2): 50 ug via INTRAVENOUS

## 2014-04-05 MED ORDER — OXYCODONE-ACETAMINOPHEN 5-325 MG PO TABS
ORAL_TABLET | ORAL | Status: AC
  Filled 2014-04-05: qty 1

## 2014-04-05 SURGICAL SUPPLY — 17 items
CLOTH BEACON ORANGE TIMEOUT ST (SAFETY) ×2 IMPLANT
DECANTER SPIKE VIAL GLASS SM (MISCELLANEOUS) ×2 IMPLANT
GLOVE BIO SURGEON STRL SZ7 (GLOVE) ×2 IMPLANT
GOWN STRL REUS W/TWL LRG LVL3 (GOWN DISPOSABLE) ×4 IMPLANT
KIT BERKELEY 1ST TRIMESTER 3/8 (MISCELLANEOUS) ×2 IMPLANT
NDL SPNL 20GX3.5 QUINCKE YW (NEEDLE) ×1 IMPLANT
NEEDLE SPNL 20GX3.5 QUINCKE YW (NEEDLE) ×2 IMPLANT
NS IRRIG 1000ML POUR BTL (IV SOLUTION) ×2 IMPLANT
PACK VAGINAL MINOR WOMEN LF (CUSTOM PROCEDURE TRAY) ×2 IMPLANT
PAD OB MATERNITY 4.3X12.25 (PERSONAL CARE ITEMS) ×2 IMPLANT
PAD PREP 24X48 CUFFED NSTRL (MISCELLANEOUS) ×2 IMPLANT
SET BERKELEY SUCTION TUBING (SUCTIONS) ×2 IMPLANT
TOWEL OR 17X24 6PK STRL BLUE (TOWEL DISPOSABLE) ×4 IMPLANT
VACURETTE 10 RIGID CVD (CANNULA) IMPLANT
VACURETTE 7MM CVD STRL WRAP (CANNULA) IMPLANT
VACURETTE 8 RIGID CVD (CANNULA) ×1 IMPLANT
VACURETTE 9 RIGID CVD (CANNULA) IMPLANT

## 2014-04-05 NOTE — Transfer of Care (Signed)
Immediate Anesthesia Transfer of Care Note  Patient: Deloris Ping [place]  Procedure(s) Performed: Procedure(s): DILATATION AND EVACUATION (N/A)  Patient Location: PACU  Anesthesia Type:MAC  Level of Consciousness: awake, alert  and oriented  Airway & Oxygen Therapy: Patient Spontanous Breathing and Patient connected to nasal cannula oxygen  Post-op Assessment: Report given to RN and Post -op Vital signs reviewed and stable  Post vital signs: Reviewed and stable  Last Vitals:  Filed Vitals:   04/05/14 1158  BP: 113/71  Pulse: 62  Temp: 36.8 C  Resp: 20    Complications: No apparent anesthesia complications

## 2014-04-05 NOTE — H&P (Signed)
  History and physical exam unchanged

## 2014-04-05 NOTE — Anesthesia Postprocedure Evaluation (Signed)
  Anesthesia Post Note  Patient: Christina Weber  Procedure(s) Performed: Procedure(s) (LRB): DILATATION AND EVACUATION (N/A)  Anesthesia type: MAC  Patient location: PACU  Post pain: Pain level controlled  Post assessment: Post-op Vital signs reviewed  Last Vitals:  Filed Vitals:   04/05/14 1158  BP: 113/71  Pulse: 62  Temp: 36.8 C  Resp: 20    Post vital signs: Reviewed  Level of consciousness: sedated  Complications: No apparent anesthesia complications,ac

## 2014-04-05 NOTE — Brief Op Note (Signed)
04/05/2014  1:21 PM  PATIENT:  Christina Weber  39 y.o. female  PRE-OPERATIVE DIAGNOSIS:  missed ab, 1st trimester  POST-OPERATIVE DIAGNOSIS:  missed abortion  PROCEDURE:  Procedure(s): DILATATION AND EVACUATION (N/A)  SURGEON:  Surgeon(s) and Role:    * Darlyn Chamber, MD - Primary  PHYSICIAN ASSISTANT:   ASSISTANTS: none   ANESTHESIA:   IV sedation and paracervical block  EBL:     BLOOD ADMINISTERED:none  DRAINS: none   LOCAL MEDICATIONS USED:  OTHER nesicaine  SPECIMEN:  Source of Specimen:  products of conception  DISPOSITION OF SPECIMEN:  PATHOLOGY  COUNTS:  YES  TOURNIQUET:  * No tourniquets in log *  DICTATION: .Other Dictation: Dictation Number [id number]  PLAN OF CARE: Discharge to home after PACU  PATIENT DISPOSITION:  PACU - hemodynamically stable.   Delay start of Pharmacological VTE agent (>24hrs) due to surgical blood loss or risk of bleeding: not applicable

## 2014-04-05 NOTE — Discharge Instructions (Signed)
DISCHARGE INSTRUCTIONS: D&C / D&E The following instructions have been prepared to help you care for yourself upon your return home.   Personal hygiene:  Use sanitary pads for vaginal drainage, not tampons.  Shower the day after your procedure.  NO tub baths, pools or Jacuzzis for 2-3 weeks.  Wipe front to back after using the bathroom.  Activity and limitations:  Do NOT drive or operate any equipment for 24 hours. The effects of anesthesia are still present and drowsiness may result.  Do NOT rest in bed all day.  Walking is encouraged.  Walk up and down stairs slowly.  You may resume your normal activity in one to two days or as indicated by your physician.  Sexual activity: NO intercourse for at least 2 weeks after the procedure, or as indicated by your physician.  Diet: Eat a light meal as desired this evening. You may resume your usual diet tomorrow.  Return to work: You may resume your work activities in one to two days or as indicated by your doctor.  What to expect after your surgery: Expect to have vaginal bleeding/discharge for 2-3 days and spotting for up to 10 days. It is not unusual to have soreness for up to 1-2 weeks. You may have a slight burning sensation when you urinate for the first day. Mild cramps may continue for a couple of days. You may have a regular period in 2-6 weeks.  Call your doctor for any of the following:  Excessive vaginal bleeding, saturating and changing one pad every hour.  Inability to urinate 6 hours after discharge from hospital.  Pain not relieved by pain medication.  Fever of 100.4 F or greater.  Unusual vaginal discharge or odor.   Call for an appointment:    Patients signature: ______________________  Nurses signature ________________________  Support person's signature_______________________

## 2014-04-05 NOTE — Anesthesia Preprocedure Evaluation (Signed)
Anesthesia Evaluation  Patient identified by MRN, date of birth, ID band Patient awake    Reviewed: Allergy & Precautions, H&P , Patient's Chart, lab work & pertinent test results, reviewed documented beta blocker date and time   History of Anesthesia Complications Negative for: history of anesthetic complications  Airway Mallampati: II  TM Distance: >3 FB Neck ROM: full    Dental   Pulmonary former smoker,  breath sounds clear to auscultation        Cardiovascular Exercise Tolerance: Good Rhythm:regular Rate:Normal     Neuro/Psych negative psych ROS   GI/Hepatic   Endo/Other    Renal/GU      Musculoskeletal   Abdominal   Peds  Hematology   Anesthesia Other Findings   Reproductive/Obstetrics                             Anesthesia Physical Anesthesia Plan  ASA: II  Anesthesia Plan: MAC   Post-op Pain Management:    Induction:   Airway Management Planned:   Additional Equipment:   Intra-op Plan:   Post-operative Plan:   Informed Consent: I have reviewed the patients History and Physical, chart, labs and discussed the procedure including the risks, benefits and alternatives for the proposed anesthesia with the patient or authorized representative who has indicated his/her understanding and acceptance.   Dental Advisory Given  Plan Discussed with: CRNA, Surgeon and Anesthesiologist  Anesthesia Plan Comments:         Anesthesia Quick Evaluation

## 2014-04-06 ENCOUNTER — Encounter (HOSPITAL_COMMUNITY): Payer: Self-pay | Admitting: Obstetrics and Gynecology

## 2014-04-06 NOTE — Op Note (Signed)
Christina, Weber NO.:  [id number]  MEDICAL RECORD NO.:  [id number]  LOCATION:  [facility]                          FACILITY:  [facility]  PHYSICIAN:  Darlyn Chamber, M.D.   DATE OF BIRTH:  01/04/1976  DATE OF PROCEDURE:  04/05/2014 DATE OF DISCHARGE:  04/05/2014                              OPERATIVE REPORT   PREOPERATIVE DIAGNOSIS:  Nonviable first trimester pregnancy.  POSTOPERATIVE DIAGNOSIS:  Nonviable first trimester pregnancy.  PROCEDURE:  Paracervical block, cervical dilatation, and uterine evacuation.  SURGEON:  Darlyn Chamber, MD  ANESTHESIA:  Paracervical block and sedation.  BLOOD LOSS:  Minimal.  PACKS AND DRAINS:  None.  INTRAOPERATIVE BLOOD PLACEMENT:  None.  COMPLICATIONS:  None.  INDICATION:  Dictated in history and physical.  PROCEDURE IN DETAIL:  The patient was taken to the OR and placed in supine position.  After sedation, she was placed in a dorsal supine position using the [employer] stirrups.  The patient was draped in sterile field.  A speculum was placed in the vaginal vault.  Cervix and vagina were cleansed out with Betadine.  Anterior lip of the cervix was anesthetized with 1% Xylocaine and secured with a single-tooth tenaculum.  Paracervical block with 1% Nesacaine was instituted. Approximately, 20 mL were used.  Uterus sounded to approximately 9-10 cm.  Cervix was serially dilated to a size 27 Pratt dilator.  A size 8 curved suction curette was introduced.  Intrauterine contents were removed using suction curetting until no additional tissue was obtained. At this point in time, we used sharp curetting.  All quadrants were felt to be clear by the gritty field.  The uterus was contracting down well. Repeat suction and curetting revealed no additional tissue.  There were no signs of perforation or active bleeding. Speculum and single-tooth tenaculum were then removed.  The patient was taken out of dorsal lithotomy position.  Once  alert, transferred to recovery room in good condition.  Sponge, instrument, and needle counts were correct by circulating nurse.     Darlyn Chamber, M.D.     JSM/MEDQ  D:  04/05/2014  T:  04/06/2014  Job:  [id number]

## 2014-05-14 ENCOUNTER — Other Ambulatory Visit: Payer: Self-pay

## 2014-05-14 DIAGNOSIS — Z1231 Encounter for screening mammogram for malignant neoplasm of breast: Secondary | ICD-10-CM

## 2014-05-24 ENCOUNTER — Ambulatory Visit
Admission: RE | Admit: 2014-05-24 | Discharge: 2014-05-24 | Disposition: A | Discharge status: Home / Self Care | Payer: BLUE CROSS/BLUE SHIELD | Source: Ambulatory Visit

## 2014-05-24 DIAGNOSIS — Z1231 Encounter for screening mammogram for malignant neoplasm of breast: Secondary | ICD-10-CM

## 2014-06-24 ENCOUNTER — Emergency Department (HOSPITAL_COMMUNITY)
Admission: EM | Admit: 2014-06-24 | Discharge: 2014-06-24 | Disposition: A | Discharge status: Home / Self Care | Payer: BLUE CROSS/BLUE SHIELD | Attending: Emergency Medicine | Admitting: Emergency Medicine

## 2014-06-24 ENCOUNTER — Encounter (HOSPITAL_COMMUNITY): Payer: Self-pay | Admitting: *Deleted

## 2014-06-24 ENCOUNTER — Emergency Department (HOSPITAL_COMMUNITY): Payer: BLUE CROSS/BLUE SHIELD

## 2014-06-24 DIAGNOSIS — Z8742 Personal history of other diseases of the female genital tract: Secondary | ICD-10-CM | POA: Insufficient documentation

## 2014-06-24 DIAGNOSIS — R05 Cough: Secondary | ICD-10-CM

## 2014-06-24 DIAGNOSIS — Z87442 Personal history of urinary calculi: Secondary | ICD-10-CM | POA: Insufficient documentation

## 2014-06-24 DIAGNOSIS — R059 Cough, unspecified: Secondary | ICD-10-CM

## 2014-06-24 DIAGNOSIS — Z3202 Encounter for pregnancy test, result negative: Secondary | ICD-10-CM | POA: Diagnosis not present

## 2014-06-24 DIAGNOSIS — Z91040 Latex allergy status: Secondary | ICD-10-CM | POA: Insufficient documentation

## 2014-06-24 DIAGNOSIS — R509 Fever, unspecified: Secondary | ICD-10-CM | POA: Diagnosis present

## 2014-06-24 DIAGNOSIS — Z8619 Personal history of other infectious and parasitic diseases: Secondary | ICD-10-CM | POA: Insufficient documentation

## 2014-06-24 DIAGNOSIS — J020 Streptococcal pharyngitis: Secondary | ICD-10-CM | POA: Diagnosis not present

## 2014-06-24 DIAGNOSIS — Z87891 Personal history of nicotine dependence: Secondary | ICD-10-CM | POA: Diagnosis not present

## 2014-06-24 LAB — URINALYSIS, ROUTINE W REFLEX MICROSCOPIC
Bilirubin Urine: NEGATIVE
Glucose, UA: NEGATIVE mg/dL
Ketones, ur: NEGATIVE mg/dL
Nitrite: NEGATIVE
Protein, ur: 30 mg/dL — AB
Specific Gravity, Urine: 1.019 (ref 1.005–1.030)
Urobilinogen, UA: 0.2 mg/dL (ref 0.0–1.0)
pH: 6.5 (ref 5.0–8.0)

## 2014-06-24 LAB — CBC WITH DIFFERENTIAL/PLATELET
Basophils Absolute: 0 10*3/uL (ref 0.0–0.1)
Basophils Relative: 0 % (ref 0–1)
Eosinophils Absolute: 0.1 10*3/uL (ref 0.0–0.7)
Eosinophils Relative: 0 % (ref 0–5)
HCT: 41.8 % (ref 36.0–46.0)
Hemoglobin: 13.8 g/dL (ref 12.0–15.0)
Lymphocytes Relative: 14 % (ref 12–46)
Lymphs Abs: 2.3 10*3/uL (ref 0.7–4.0)
MCH: 29.2 pg (ref 26.0–34.0)
MCHC: 33 g/dL (ref 30.0–36.0)
MCV: 88.6 fL (ref 78.0–100.0)
Monocytes Absolute: 1.2 10*3/uL — ABNORMAL HIGH (ref 0.1–1.0)
Monocytes Relative: 7 % (ref 3–12)
Neutro Abs: 13.4 10*3/uL — ABNORMAL HIGH (ref 1.7–7.7)
Neutrophils Relative %: 79 % — ABNORMAL HIGH (ref 43–77)
Platelets: 255 10*3/uL (ref 150–400)
RBC: 4.72 MIL/uL (ref 3.87–5.11)
RDW: 13.5 % (ref 11.5–15.5)
WBC: 17 10*3/uL — ABNORMAL HIGH (ref 4.0–10.5)

## 2014-06-24 LAB — COMPREHENSIVE METABOLIC PANEL
ALT: 21 U/L (ref 14–54)
AST: 17 U/L (ref 15–41)
Albumin: 3.4 g/dL — ABNORMAL LOW (ref 3.5–5.0)
Alkaline Phosphatase: 70 U/L (ref 38–126)
Anion gap: 12 (ref 5–15)
BUN: 8 mg/dL (ref 6–20)
CO2: 20 mmol/L — ABNORMAL LOW (ref 22–32)
Calcium: 8.4 mg/dL — ABNORMAL LOW (ref 8.9–10.3)
Chloride: 102 mmol/L (ref 101–111)
Creatinine, Ser: 0.73 mg/dL (ref 0.44–1.00)
GFR calc Af Amer: >60 mL/min (ref >60)
GFR calc non Af Amer: >60 mL/min (ref >60)
Glucose, Bld: 120 mg/dL — ABNORMAL HIGH (ref 65–99)
Potassium: 4 mmol/L (ref 3.5–5.1)
Sodium: 134 mmol/L — ABNORMAL LOW (ref 135–145)
Total Bilirubin: 0.1 mg/dL — ABNORMAL LOW (ref 0.3–1.2)
Total Protein: 7 g/dL (ref 6.5–8.1)

## 2014-06-24 LAB — URINE MICROSCOPIC-ADD ON

## 2014-06-24 LAB — RAPID STREP SCREEN (MED CTR MEBANE ONLY): Streptococcus, Group A Screen (Direct): POSITIVE — AB

## 2014-06-24 LAB — POC URINE PREG, ED: Preg Test, Ur: NEGATIVE

## 2014-06-24 LAB — I-STAT CG4 LACTIC ACID, ED: Lactic Acid, Venous: 1.01 mmol/L (ref 0.5–2.0)

## 2014-06-24 MED ORDER — KETOROLAC TROMETHAMINE 30 MG/ML IJ SOLN
30.0000 mg | Freq: Once | INTRAMUSCULAR | Status: AC
  Administered 2014-06-24: 30 mg via INTRAVENOUS
  Filled 2014-06-24: qty 1

## 2014-06-24 MED ORDER — SODIUM CHLORIDE 0.9 % IV BOLUS (SEPSIS)
1000.0000 mL | Freq: Once | INTRAVENOUS | Status: AC
  Administered 2014-06-24: 1000 mL via INTRAVENOUS

## 2014-06-24 MED ORDER — DEXAMETHASONE SODIUM PHOSPHATE 10 MG/ML IJ SOLN
10.0000 mg | Freq: Once | INTRAMUSCULAR | Status: AC
  Administered 2014-06-24: 10 mg via INTRAVENOUS
  Filled 2014-06-24: qty 1

## 2014-06-24 MED ORDER — IBUPROFEN 100 MG/5ML PO SUSP: 400.0000 mg | Freq: Four times a day (QID) | ORAL | Status: DC | PRN

## 2014-06-24 MED ORDER — PENICILLIN G BENZATHINE 1200000 UNIT/2ML IM SUSP
1.2000 10*6.[IU] | Freq: Once | INTRAMUSCULAR | Status: AC
  Administered 2014-06-24: 1.2 10*6.[IU] via INTRAMUSCULAR
  Filled 2014-06-24: qty 2

## 2014-06-24 NOTE — ED Notes (Signed)
Pt c/o fever, sore throat since 1330 yesterday. Pt reports taking OTC to help with fever and sore throat. Last dose of ibuprofen at 1500. Pt also c/o swollen glands in her neck.

## 2014-06-24 NOTE — Discharge Instructions (Signed)
You have a strep infection, and the antibiotics to treat it has been administered. Please hydrate well.   Strep Throat Strep throat is an infection of the throat caused by a bacteria named Streptococcus pyogenes. Your health care provider may call the infection streptococcal "tonsillitis" or "pharyngitis" depending on whether there are signs of inflammation in the tonsils or back of the throat. Strep throat is most common in children aged 41-15 years during the cold months of the year, but it can occur in people of any age during any season. This infection is spread from person to person (contagious) through coughing, sneezing, or other close contact. SIGNS AND SYMPTOMS   Fever or chills.  Painful, swollen, red tonsils or throat.  Pain or difficulty when swallowing.  White or yellow spots on the tonsils or throat.  Swollen, tender lymph nodes or "glands" of the neck or under the jaw.  Red rash all over the body (rare). DIAGNOSIS  Many different infections can cause the same symptoms. A test must be done to confirm the diagnosis so the right treatment can be given. A "rapid strep test" can help your health care provider make the diagnosis in a few minutes. If this test is not available, a light swab of the infected area can be used for a throat culture test. If a throat culture test is done, results are usually available in a day or two. TREATMENT  Strep throat is treated with antibiotic medicine. HOME CARE INSTRUCTIONS   Gargle with 1 tsp of salt in 1 cup of warm water, 3-4 times per day or as needed for comfort.  Family members who also have a sore throat or fever should be tested for strep throat and treated with antibiotics if they have the strep infection.  Make sure everyone in your household washes their hands well.  Do not share food, drinking cups, or personal items that could cause the infection to spread to others.  You may need to eat a soft food diet until your sore throat  gets better.  Drink enough water and fluids to keep your urine clear or pale yellow. This will help prevent dehydration.  Get plenty of rest.  Stay home from school, day care, or work until you have been on antibiotics for 24 hours.  Take medicines only as directed by your health care provider.  Take your antibiotic medicine as directed by your health care provider. Finish it even if you start to feel better. SEEK MEDICAL CARE IF:   The glands in your neck continue to enlarge.  You develop a rash, cough, or earache.  You cough up green, yellow-brown, or bloody sputum.  You have pain or discomfort not controlled by medicines.  Your problems seem to be getting worse rather than better.  You have a fever. SEEK IMMEDIATE MEDICAL CARE IF:   You develop any new symptoms such as vomiting, severe headache, stiff or painful neck, chest pain, shortness of breath, or trouble swallowing.  You develop severe throat pain, drooling, or changes in your voice.  You develop swelling of the neck, or the skin on the neck becomes red and tender.  You develop signs of dehydration, such as fatigue, dry mouth, and decreased urination.  You become increasingly sleepy, or you cannot wake up completely. MAKE SURE YOU:  Understand these instructions.  Will watch your condition.  Will get help right away if you are not doing well or get worse. Document Released: 01/24/2000 Document Revised: 06/12/2013 Document Reviewed:  03/27/2010 ExitCare Patient Information 2015 [place], [place]. This information is not intended to replace advice given to you by your health care provider. Make sure you discuss any questions you have with your health care provider.

## 2014-06-24 NOTE — ED Provider Notes (Signed)
CSN: [id number]     Arrival date & time 06/24/14  1757 History   First MD Initiated Contact with Patient 06/24/14 1821     Chief Complaint  Patient presents with  . Fever  . Sore Throat  . Cough     (Consider location/radiation/quality/duration/timing/severity/associated sxs/prior Treatment) HPI Comments: Pt comes in with cc of fevers, sore throat and cough. She started with sore throat 2 days ago, and had fevers starting yday. She has change in her voice, and hoarseness. There is decreased po intake. + sick contacts at work.   Patient is a 39 y.o. female presenting with fever, pharyngitis, and cough. The history is provided by the patient.  Fever Associated symptoms: cough and sore throat   Associated symptoms: no congestion and no rash   Sore Throat  Cough Associated symptoms: fever and sore throat   Associated symptoms: no rash     Past Medical History  Diagnosis Date  . Kidney stone   . Abnormal Pap smear   . HPV in female 2010  . Breast abscess     right breast  . Complication of anesthesia     03/2001 anesthesia caused umbilical area to harden   Past Surgical History  Procedure Laterality Date  . Laparoscopy  03/2001  . Dilation and curettage of uterus    . Dilation and evacuation N/A 04/05/2014    Procedure: DILATATION AND EVACUATION;  Surgeon: Darlyn Chamber, MD;  Location: [facility];  Service: Gynecology;  Laterality: N/A;   Family History  Problem Relation Age of Onset  . Cancer Mother     brain  . Hypertension Father   . Diabetes Paternal Grandmother   . Hypertension Paternal Grandmother    History  Substance Use Topics  . Smoking status: Former Smoker    Quit date: 05/25/2012  . Smokeless tobacco: Never Used  . Alcohol Use: No   OB History    Gravida Para Term Preterm AB TAB SAB Ectopic Multiple Living   6 4 2 2 2 1  1  4      Review of Systems  Constitutional: Positive for fever.  HENT: Positive for sore throat. Negative for congestion, dental  problem and drooling.   Respiratory: Positive for cough.   Skin: Negative for rash.      Allergies  Latex  Home Medications   Prior to Admission medications   Medication Sig Start Date End Date Taking? Authorizing Provider  ibuprofen (CHILDRENS IBUPROFEN) 100 MG/5ML suspension Take 20 mLs (400 mg total) by mouth every 6 (six) hours as needed for fever. 06/24/14   Varney Biles, MD  oxyCODONE-acetaminophen (PERCOCET) 7.5-325 MG per tablet Take 1 tablet by mouth every 4 (four) hours as needed for pain. 04/05/14   Arvella Nigh, MD  Prenatal Vit-Fe Fumarate-FA (PRENATAL MULTIVITAMIN) TABS tablet Take 1 tablet by mouth daily at 12 noon.    Historical Provider, MD   BP 112/70 mmHg  Pulse 94  Temp(Src) 100.3 F (37.9 C) (Oral)  Resp 20  Ht 5' 2.5" (1.588 m)  Wt 165 lb 1 oz (74.872 kg)  BMI 29.69 kg/m2  SpO2 100%  LMP 06/17/2014 Physical Exam  Constitutional: She is oriented to person, place, and time. She appears well-developed and well-nourished.  HENT:  Head: Normocephalic and atraumatic.  Mouth/Throat: Oropharyngeal exudate present.  Eyes: EOM are normal. Pupils are equal, round, and reactive to light.  Neck: Neck supple.  Cardiovascular: Regular rhythm and normal heart sounds.   Pulmonary/Chest: Effort normal. No respiratory  distress. She has no wheezes.  Abdominal: Soft.  Lymphadenopathy:    She has cervical adenopathy.  Neurological: She is alert and oriented to person, place, and time.  Skin: Skin is warm and dry.  Nursing note and vitals reviewed.   ED Course  Procedures (including critical care time) Labs Review Labs Reviewed  RAPID STREP SCREEN - Abnormal; Notable for the following:    Streptococcus, Group A Screen (Direct) POSITIVE (*)    All other components within normal limits  CBC WITH DIFFERENTIAL/PLATELET - Abnormal; Notable for the following:    WBC 17.0 (*)    Neutrophils Relative % 79 (*)    Neutro Abs 13.4 (*)    Monocytes Absolute 1.2 (*)    All  other components within normal limits  COMPREHENSIVE METABOLIC PANEL - Abnormal; Notable for the following:    Sodium 134 (*)    CO2 20 (*)    Glucose, Bld 120 (*)    Calcium 8.4 (*)    Albumin 3.4 (*)    Total Bilirubin 0.1 (*)    All other components within normal limits  URINALYSIS, ROUTINE W REFLEX MICROSCOPIC - Abnormal; Notable for the following:    APPearance CLOUDY (*)    Hgb urine dipstick SMALL (*)    Protein, ur 30 (*)    Leukocytes, UA SMALL (*)    All other components within normal limits  URINE MICROSCOPIC-ADD ON - Abnormal; Notable for the following:    Squamous Epithelial / LPF MANY (*)    All other components within normal limits  CULTURE, BLOOD (ROUTINE X 2)  CULTURE, BLOOD (ROUTINE X 2)  URINE CULTURE  POC URINE PREG, ED  I-STAT CG4 LACTIC ACID, ED    Imaging Review Dg Chest 2 View  06/24/2014   CLINICAL DATA:  Fever, cough and sore throat 1 day.  EXAM: CHEST  2 VIEW  COMPARISON:  None.  FINDINGS: The heart size and mediastinal contours are within normal limits. Both lungs are clear. The visualized skeletal structures are unremarkable.  IMPRESSION: No active cardiopulmonary disease.   Electronically Signed   By: Marin Olp M.D.   On: 06/24/2014 19:19     EKG Interpretation None      MDM   Final diagnoses:  Acute streptococcal pharyngitis    Pt comes in with fever, tachycardia. She has strep pharyngitis. Will give im bicilin and iv toradol and dex. Stable for d/c.    Varney Biles, MD 06/24/14 2142

## 2014-06-24 NOTE — ED Notes (Signed)
Pt A&OX4, ambulatory at d/c with steady gait, NAD 

## 2014-06-25 LAB — URINE CULTURE: Colony Count: >=100000

## 2014-06-30 LAB — CULTURE, BLOOD (ROUTINE X 2)
Culture: NO GROWTH
Culture: NO GROWTH

## 2015-04-02 ENCOUNTER — Other Ambulatory Visit: Payer: Self-pay

## 2015-04-02 DIAGNOSIS — Z1231 Encounter for screening mammogram for malignant neoplasm of breast: Secondary | ICD-10-CM

## 2015-05-27 ENCOUNTER — Ambulatory Visit
Admission: RE | Admit: 2015-05-27 | Discharge: 2015-05-27 | Disposition: A | Discharge status: Home / Self Care | Payer: 59 | Source: Ambulatory Visit

## 2015-05-27 DIAGNOSIS — Z1231 Encounter for screening mammogram for malignant neoplasm of breast: Secondary | ICD-10-CM

## 2016-04-24 ENCOUNTER — Other Ambulatory Visit: Payer: Self-pay | Admitting: Obstetrics and Gynecology

## 2016-04-24 DIAGNOSIS — Z1231 Encounter for screening mammogram for malignant neoplasm of breast: Secondary | ICD-10-CM

## 2016-05-27 ENCOUNTER — Ambulatory Visit
Admission: RE | Admit: 2016-05-27 | Discharge: 2016-05-27 | Disposition: A | Discharge status: Home / Self Care | Payer: 59 | Source: Ambulatory Visit | Attending: Obstetrics and Gynecology | Admitting: Obstetrics and Gynecology

## 2016-05-27 DIAGNOSIS — Z1231 Encounter for screening mammogram for malignant neoplasm of breast: Secondary | ICD-10-CM

## 2016-09-19 ENCOUNTER — Inpatient Hospital Stay (HOSPITAL_COMMUNITY)
Admission: AD | Admit: 2016-09-19 | Discharge: 2016-09-19 | Disposition: A | Discharge status: Home / Self Care | Payer: 59 | Source: Ambulatory Visit | Attending: Family Medicine | Admitting: Family Medicine

## 2016-09-19 ENCOUNTER — Inpatient Hospital Stay (HOSPITAL_COMMUNITY): Payer: 59

## 2016-09-19 ENCOUNTER — Encounter (HOSPITAL_COMMUNITY): Payer: Self-pay

## 2016-09-19 DIAGNOSIS — O3680X0 Pregnancy with inconclusive fetal viability, not applicable or unspecified: Secondary | ICD-10-CM | POA: Insufficient documentation

## 2016-09-19 DIAGNOSIS — N644 Mastodynia: Secondary | ICD-10-CM | POA: Insufficient documentation

## 2016-09-19 DIAGNOSIS — O209 Hemorrhage in early pregnancy, unspecified: Secondary | ICD-10-CM | POA: Insufficient documentation

## 2016-09-19 DIAGNOSIS — O4690 Antepartum hemorrhage, unspecified, unspecified trimester: Secondary | ICD-10-CM

## 2016-09-19 DIAGNOSIS — Z87442 Personal history of urinary calculi: Secondary | ICD-10-CM | POA: Diagnosis not present

## 2016-09-19 DIAGNOSIS — O039 Complete or unspecified spontaneous abortion without complication: Secondary | ICD-10-CM

## 2016-09-19 DIAGNOSIS — R109 Unspecified abdominal pain: Secondary | ICD-10-CM | POA: Insufficient documentation

## 2016-09-19 DIAGNOSIS — O09521 Supervision of elderly multigravida, first trimester: Secondary | ICD-10-CM | POA: Diagnosis not present

## 2016-09-19 DIAGNOSIS — O26891 Other specified pregnancy related conditions, first trimester: Secondary | ICD-10-CM | POA: Diagnosis present

## 2016-09-19 DIAGNOSIS — Z3A00 Weeks of gestation of pregnancy not specified: Secondary | ICD-10-CM | POA: Insufficient documentation

## 2016-09-19 DIAGNOSIS — Z87891 Personal history of nicotine dependence: Secondary | ICD-10-CM | POA: Diagnosis not present

## 2016-09-19 LAB — URINALYSIS, ROUTINE W REFLEX MICROSCOPIC
Bacteria, UA: NONE SEEN
Bilirubin Urine: NEGATIVE
Glucose, UA: NEGATIVE mg/dL
Ketones, ur: NEGATIVE mg/dL
Leukocytes, UA: NEGATIVE
Nitrite: NEGATIVE
Protein, ur: NEGATIVE mg/dL
Specific Gravity, Urine: 1.017 (ref 1.005–1.030)
pH: 6 (ref 5.0–8.0)

## 2016-09-19 LAB — HCG, QUANTITATIVE, PREGNANCY: hCG, Beta Chain, Quant, S: 180 m[IU]/mL — ABNORMAL HIGH (ref <5)

## 2016-09-19 LAB — POCT PREGNANCY, URINE: Preg Test, Ur: POSITIVE — AB

## 2016-09-19 NOTE — MAU Note (Signed)
Started period 08/03 and reports she is still bleeding. Was not a normal period for her- had "tissue mixed in it". Still spotting with some breast pain and nausea.

## 2016-09-19 NOTE — MAU Provider Note (Signed)
History   [id number] @ 4.6 wks in with bleeding and cramping x 8 days. States also has passed tissue. Only spotting today with mild cramping,  but it has been heavy with tissue and clots.Pt states she was attempting pregnancy also states aware of risk for AMA.   CSN: [id number]  Arrival date & time 09/19/16  1010   None     Chief Complaint  Patient presents with  . Vaginal Bleeding  . Breast Pain    HPI  Past Medical History:  Diagnosis Date  . Abnormal Pap smear   . Breast abscess    right breast  . Complication of anesthesia    03/2001 anesthesia caused umbilical area to harden  . HPV in female 2010  . Kidney stone     Past Surgical History:  Procedure Laterality Date  . BREAST CYST ASPIRATION    . DILATION AND CURETTAGE OF UTERUS    . DILATION AND EVACUATION N/A 04/05/2014   Procedure: DILATATION AND EVACUATION;  Surgeon: Darlyn Chamber, MD;  Location: [facility];  Service: Gynecology;  Laterality: N/A;  . LAPAROSCOPY  03/2001    Family History  Problem Relation Age of Onset  . Cancer Mother        brain  . Hypertension Father   . Diabetes Paternal Grandmother   . Hypertension Paternal Grandmother     Social History  Substance Use Topics  . Smoking status: Former Smoker    Quit date: 05/25/2012  . Smokeless tobacco: Never Used  . Alcohol use No     Comment: occasionally    OB History    Gravida Para Term Preterm AB Living   7 4 2 2 2 4    SAB TAB Ectopic Multiple Live Births     1 1   4       Review of Systems  Constitutional: Negative.   HENT: Negative.   Eyes: Negative.   Respiratory: Negative.   Cardiovascular: Negative.   Gastrointestinal: Positive for abdominal pain.  Endocrine: Negative.   Genitourinary: Positive for vaginal bleeding.  Musculoskeletal: Negative.   Skin: Negative.   Allergic/Immunologic: Negative.   Neurological: Negative.   Hematological: Negative.   Psychiatric/Behavioral: Negative.     Allergies  Latex  Home Medications     BP (!) 152/84   Pulse 88   Temp 98.8 F (37.1 C) (Oral)   Resp 20   Ht 5' 2.5" (1.588 m)   Wt 182 lb (82.6 kg)   LMP 08/16/2016   BMI 32.76 kg/m   Physical Exam  Constitutional: She is oriented to person, place, and time. She appears well-developed and well-nourished.  HENT:  Head: Normocephalic.  Eyes: Pupils are equal, round, and reactive to light.  Cardiovascular: Normal rate, regular rhythm, normal heart sounds and intact distal pulses.   Pulmonary/Chest: Effort normal and breath sounds normal.  Abdominal: Soft. Bowel sounds are normal.  Genitourinary: Vagina normal and uterus normal.  Musculoskeletal: Normal range of motion.  Neurological: She is alert and oriented to person, place, and time. She has normal reflexes.  Skin: Skin is warm and dry.  Psychiatric: She has a normal mood and affect. Her behavior is normal. Judgment and thought content normal.    MAU Course  Procedures (including critical care time)  Labs Reviewed  POCT PREGNANCY, URINE - Abnormal; Notable for the following:       Result Value   Preg Test, Ur POSITIVE (*)    All other components within normal limits  URINALYSIS,  ROUTINE W REFLEX MICROSCOPIC  HCG, QUANTITATIVE, PREGNANCY   No results found.   1. Vaginal bleeding in pregnancy, first trimester   2. Elderly multigravida in first trimester       MDM  VSS, scant amt vag bleeding with exam. Quant 180. [place] reviewed by Dr. Kennon Rounds suggestive of miscarriage. Pt to be d/c home to f/u Wednesday in clinic for repeat quant.

## 2016-09-19 NOTE — Discharge Instructions (Signed)
Miscarriage A miscarriage is the loss of an unborn baby (fetus) before the 20th week of pregnancy. The cause is often unknown. Follow these instructions at home:  You may need to stay in bed (bed rest), or you may be able to do light activity. Go about activity as told by your doctor.  Have help at home.  Write down how many pads you use each day. Write down how soaked they are.  Do not use tampons. Do not wash out your vagina (douche) or have sex (intercourse) until your doctor approves.  Only take medicine as told by your doctor.  Do not take aspirin.  Keep all doctor visits as told.  If you or your partner have problems with grieving, talk to your doctor. You can also try counseling. Give yourself time to grieve before trying to get pregnant again. Get help right away if:  You have bad cramps or pain in your back or belly (abdomen).  You have a fever.  You pass large clumps of blood (clots) from your vagina that are walnut-sized or larger. Save the clumps for your doctor to see.  You pass large amounts of tissue from your vagina. Save the tissue for your doctor to see.  You have more bleeding.  You have thick, bad-smelling fluid (discharge) coming from the vagina.  You get lightheaded, weak, or you pass out (faint).  You have chills. This information is not intended to replace advice given to you by your health care provider. Make sure you discuss any questions you have with your health care provider. Document Released: 04/20/2011 Document Revised: 07/04/2015 Document Reviewed: 02/26/2011 Elsevier Interactive Patient Education  2017 [employer].

## 2016-09-21 ENCOUNTER — Telehealth: Payer: Self-pay

## 2016-09-21 NOTE — Telephone Encounter (Signed)
Called patient left her a message to call us back regarding scheduling an appointment for 09/23/2016 for a repeat quant check not stat.

## 2016-09-21 NOTE — Telephone Encounter (Signed)
-----   Message from Christina Weber, CNM sent at 09/19/2016  1:44 PM EDT ----- Pt needs repat quant on Wednesday 09/23/16 Was no pages in tellow book to schedule.Marland KitchenMarland Kitchen

## 2016-09-28 ENCOUNTER — Encounter (HOSPITAL_COMMUNITY): Payer: Self-pay | Admitting: *Deleted

## 2016-09-28 ENCOUNTER — Inpatient Hospital Stay (HOSPITAL_COMMUNITY)
Admission: AD | Admit: 2016-09-28 | Discharge: 2016-09-28 | Disposition: A | Discharge status: Home / Self Care | Payer: 59 | Source: Ambulatory Visit | Attending: Obstetrics and Gynecology | Admitting: Obstetrics and Gynecology

## 2016-09-28 ENCOUNTER — Inpatient Hospital Stay (HOSPITAL_COMMUNITY): Payer: 59

## 2016-09-28 DIAGNOSIS — R1909 Other intra-abdominal and pelvic swelling, mass and lump: Secondary | ICD-10-CM | POA: Insufficient documentation

## 2016-09-28 DIAGNOSIS — Z87891 Personal history of nicotine dependence: Secondary | ICD-10-CM | POA: Diagnosis not present

## 2016-09-28 DIAGNOSIS — R109 Unspecified abdominal pain: Secondary | ICD-10-CM | POA: Diagnosis present

## 2016-09-28 DIAGNOSIS — O26899 Other specified pregnancy related conditions, unspecified trimester: Secondary | ICD-10-CM

## 2016-09-28 DIAGNOSIS — O26891 Other specified pregnancy related conditions, first trimester: Secondary | ICD-10-CM | POA: Insufficient documentation

## 2016-09-28 DIAGNOSIS — Z91040 Latex allergy status: Secondary | ICD-10-CM | POA: Diagnosis not present

## 2016-09-28 DIAGNOSIS — N9489 Other specified conditions associated with female genital organs and menstrual cycle: Secondary | ICD-10-CM

## 2016-09-28 DIAGNOSIS — Z3A01 Less than 8 weeks gestation of pregnancy: Secondary | ICD-10-CM | POA: Insufficient documentation

## 2016-09-28 HISTORY — DX: Psoriasis, unspecified: L40.9

## 2016-09-28 LAB — CBC
HCT: 37.4 % (ref 36.0–46.0)
Hemoglobin: 12.5 g/dL (ref 12.0–15.0)
MCH: 29.8 pg (ref 26.0–34.0)
MCHC: 33.4 g/dL (ref 30.0–36.0)
MCV: 89.3 fL (ref 78.0–100.0)
Platelets: 273 10*3/uL (ref 150–400)
RBC: 4.19 MIL/uL (ref 3.87–5.11)
RDW: 14.4 % (ref 11.5–15.5)
WBC: 9.7 10*3/uL (ref 4.0–10.5)

## 2016-09-28 LAB — COMPREHENSIVE METABOLIC PANEL
ALT: 15 U/L (ref 14–54)
AST: 16 U/L (ref 15–41)
Albumin: 3.8 g/dL (ref 3.5–5.0)
Alkaline Phosphatase: 61 U/L (ref 38–126)
Anion gap: 9 (ref 5–15)
BUN: 18 mg/dL (ref 6–20)
CO2: 21 mmol/L — ABNORMAL LOW (ref 22–32)
Calcium: 8.2 mg/dL — ABNORMAL LOW (ref 8.9–10.3)
Chloride: 103 mmol/L (ref 101–111)
Creatinine, Ser: 0.71 mg/dL (ref 0.44–1.00)
GFR calc Af Amer: >60 mL/min (ref >60)
GFR calc non Af Amer: >60 mL/min (ref >60)
Glucose, Bld: 102 mg/dL — ABNORMAL HIGH (ref 65–99)
Potassium: 3.7 mmol/L (ref 3.5–5.1)
Sodium: 133 mmol/L — ABNORMAL LOW (ref 135–145)
Total Bilirubin: 0.2 mg/dL — ABNORMAL LOW (ref 0.3–1.2)
Total Protein: 6.9 g/dL (ref 6.5–8.1)

## 2016-09-28 LAB — HCG, QUANTITATIVE, PREGNANCY: hCG, Beta Chain, Quant, S: 726 m[IU]/mL — ABNORMAL HIGH (ref <5)

## 2016-09-28 MED ORDER — ACETAMINOPHEN 500 MG PO TABS
1000.0000 mg | ORAL_TABLET | Freq: Once | ORAL | Status: AC
  Administered 2016-09-28: 1000 mg via ORAL
  Filled 2016-09-28: qty 2

## 2016-09-28 MED ORDER — METHOTREXATE INJECTION FOR WOMEN'S HOSPITAL
50.0000 mg/m2 | Freq: Once | INTRAMUSCULAR | Status: AC
  Administered 2016-09-28: 95 mg via INTRAMUSCULAR
  Filled 2016-09-28: qty 1.9

## 2016-09-28 MED ORDER — KETOROLAC TROMETHAMINE 60 MG/2ML IM SOLN
60.0000 mg | Freq: Once | INTRAMUSCULAR | Status: AC
  Administered 2016-09-28: 60 mg via INTRAMUSCULAR
  Filled 2016-09-28: qty 2

## 2016-09-28 NOTE — MAU Provider Note (Signed)
History     CSN: [id number]  Arrival date and time: 09/28/16 0705   None     Chief Complaint  Patient presents with  . Abdominal Pain  . Back Pain  . Vaginal Bleeding   HPI   Ms.Christina Weber is a 41 y.o. female [phone] @ 57w1dhere in MAU with abdominal pain and vaginal bleeding. She was diagnosed with SAB 1 week ago here in MAU. She had been bleeding for a which and it last Tuesday. The bleeding and pain started again last night. It is very light spotting, and she only notices it when she wipes. The pain is located in the lower abdomen R>L, the pain radiates around to her lower back. The pain is similar to labor pains.   OB History    Gravida Para Term Preterm AB Living   7 4 2 2 2 4    SAB TAB Ectopic Multiple Live Births     1 1   4       Past Medical History:  Diagnosis Date  . Abnormal Pap smear   . Breast abscess    right breast  . Complication of anesthesia    ? states caused umbilical area to harden  . HPV in female 2010  . Infection    UTI  . Kidney stone    pyelo  . Psoriasis   . Vaginal Pap smear, abnormal     Past Surgical History:  Procedure Laterality Date  . BREAST CYST ASPIRATION    . DILATION AND CURETTAGE OF UTERUS    . DILATION AND EVACUATION N/A 04/05/2014   Procedure: DILATATION AND EVACUATION;  Surgeon: JDarlyn Chamber MD;  [facility];  Service: Gynecology;  Laterality: N/A;  . LAPAROSCOPY  03/2001  . LEEP      Family History  Problem Relation Age of Onset  . Cancer Mother        brain  . Hypertension Father   . Cancer Father        lymph nodes  . Diabetes Paternal Grandmother   . Hypertension Paternal Grandmother   . Hearing loss Neg Hx     Social History  Substance Use Topics  . Smoking status: Former Smoker    Types: Cigarettes    Quit date: 05/25/2012  . Smokeless tobacco: Never Used  . Alcohol use Yes     Comment: occasionally    Allergies:  Allergies  Allergen Reactions  . Latex Swelling, Rash and Other  (See Comments)    Reaction: Localized swelling    Prescriptions Prior to Admission  Medication Sig Dispense Refill Last Dose  . calcium carbonate (OSCAL) 1500 (600 Ca) MG TABS tablet Take 600 mg of elemental calcium by mouth 2 (two) times daily with a meal.   09/18/2016 at Unknown time  . ferrous sulfate 325 (65 FE) MG tablet Take 325 mg by mouth daily with breakfast.   09/18/2016 at Unknown time  . Misc Natural Products (TURMERIC CURCUMIN) CAPS Take 1 capsule by mouth daily.   09/18/2016 at Unknown time  . vitamin B-12 (CYANOCOBALAMIN) 1000 MCG tablet Take 1,000 mcg by mouth daily.   09/18/2016 at Unknown time  . vitamin E (VITAMIN E) 400 UNIT capsule Take 400 Units by mouth daily.   09/18/2016 at Unknown time   Results for orders placed or performed during the hospital encounter of 09/28/16 (from the past 48 hour(s))  hCG, quantitative, pregnancy     Status: Abnormal   Collection Time: 09/28/16  8:13 AM  Result Value Ref Range   hCG, Beta Chain, Quant, S 726 (H) <5 mIU/mL    Comment:          GEST. AGE      CONC.  (mIU/mL)   <=1 WEEK        5 - 50     2 WEEKS       50 - 500     3 WEEKS       100 - 10,000     4 WEEKS     1,000 - 30,000     5 WEEKS     3,500 - 115,000   6-8 WEEKS     12,000 - 270,000    12 WEEKS     15,000 - 220,000        FEMALE AND NON-PREGNANT FEMALE:     LESS THAN 5 mIU/mL   CBC     Status: None   Collection Time: 09/28/16  8:13 AM  Result Value Ref Range   WBC 9.7 4.0 - 10.5 K/uL   RBC 4.19 3.87 - 5.11 MIL/uL   Hemoglobin 12.5 12.0 - 15.0 g/dL   HCT 37.4 36.0 - 46.0 %   MCV 89.3 78.0 - 100.0 fL   MCH 29.8 26.0 - 34.0 pg   MCHC 33.4 30.0 - 36.0 g/dL   RDW 14.4 11.5 - 15.5 %   Platelets 273 150 - 400 K/uL  Comprehensive metabolic panel     Status: Abnormal   Collection Time: 09/28/16  8:13 AM  Result Value Ref Range   Sodium 133 (L) 135 - 145 mmol/L   Potassium 3.7 3.5 - 5.1 mmol/L   Chloride 103 101 - 111 mmol/L   CO2 21 (L) 22 - 32 mmol/L   Glucose, Bld  102 (H) 65 - 99 mg/dL   BUN 18 6 - 20 mg/dL   Creatinine, Ser 0.71 0.44 - 1.00 mg/dL   Calcium 8.2 (L) 8.9 - 10.3 mg/dL   Total Protein 6.9 6.5 - 8.1 g/dL   Albumin 3.8 3.5 - 5.0 g/dL   AST 16 15 - 41 U/L   ALT 15 14 - 54 U/L   Alkaline Phosphatase 61 38 - 126 U/L   Total Bilirubin 0.2 (L) 0.3 - 1.2 mg/dL   GFR calc non Af Amer >60 >60 mL/min   GFR calc Af Amer >60 >60 mL/min    Comment: (NOTE) The eGFR has been calculated using the CKD EPI equation. This calculation has not been validated in all clinical situations. eGFR's persistently <60 mL/min signify possible Chronic Kidney Disease.    Anion gap 9 5 - 15    US Ob Transvaginal  Result Date: 09/28/2016 CLINICAL DATA:  Pregnant patient with vaginal bleeding. Right lower quadrant pain. EXAM: TRANSVAGINAL OB ULTRASOUND TECHNIQUE: Transvaginal ultrasound was performed for complete evaluation of the gestation as well as the maternal uterus, adnexal regions, and pelvic cul-de-sac. COMPARISON:  Pelvic ultrasound 03/22/2016 FINDINGS: Intrauterine gestational sac: None Yolk sac:  Not Visualized. Embryo:  Not Visualized. Cardiac Activity: Not Visualized. Subchorionic hemorrhage:  None visualized. Maternal uterus/adnexae: Normal appearance of the left ovary. There is a cyst within the right ovary. Within the right adnexa there is a 2 cm solid mass with vascular flow. Trace pelvic free fluid. IMPRESSION: Right adnexal mass concerning for right tubal ectopic pregnancy. No intrauterine gestation identified. Critical Value/emergent results were called by telephone at the time of interpretation on 09/28/2016 at 10:15 am to Dr. Noni Saupe ,  who verbally acknowledged these results. Electronically Signed   By: Lovey Newcomer M.D.   On: 09/28/2016 10:11    Review of Systems  Gastrointestinal: Positive for abdominal pain and nausea.  Genitourinary: Negative for vaginal bleeding.   Physical Exam   Blood pressure 133/82, pulse 83, temperature 98.4 F  (36.9 C), temperature source Oral, resp. rate 18, weight 184 lb 4 oz (83.6 kg), last menstrual period 08/16/2016, SpO2 99 %.  Physical Exam  Constitutional: She is oriented to person, place, and time. She appears well-developed and well-nourished. No distress.  HENT:  Head: Normocephalic.  Eyes: Pupils are equal, round, and reactive to light.  GI: Soft. Normal appearance. There is tenderness in the right lower quadrant and suprapubic area. There is no rigidity, no rebound and no guarding.  Genitourinary:  Genitourinary Comments: Vagina - Small amount of brown mucoid discharge,  no odor  Cervix - No contact bleeding, no active bleeding  Bimanual exam: Cervix closed Uterus non tender, normal size Right Adnexal tenderness, no masses bilaterally Chaperone present for exam.   Musculoskeletal: Normal range of motion.  Neurological: She is alert and oriented to person, place, and time.  Skin: Skin is warm. She is not diaphoretic.  Psychiatric: Her behavior is normal.   MAU Course  Procedures  None  MDM  CBC, CMP Toradol 60 mg IM Pain improved  US shows adnexal mass.  Discussed with Dr. Rip Harbour, patient counseled for MTX treatment.  MTX given Tylenol 1 gram given PO B positive blood type   Assessment and Plan   A:  1. Adnexal mass   2. Abdominal pain in pregnancy, antepartum     P:  Discharge home in stable condition  Go to the [facility] [facility] [facility] on Thursday @ 1100 for Quant Strict return precautions Patient has strenuous job, work noted provided for 1 week.  Pelvic rest Return to MAU if symptoms worsen  Katelinn Justice, Artist Pais, NP 09/28/2016 2:16 PM

## 2016-09-28 NOTE — MAU Note (Signed)
Was dx with a miscarriage (low HCG and nothing seen on [place]). Bleeding had stopped.  Started spotting again last night. Pain became worse during the night, RLQ and rt lower back.  Hx of ectopic, worried about that.

## 2016-09-28 NOTE — Discharge Instructions (Signed)
Methotrexate Treatment for an Ectopic Pregnancy, Care After Refer to this sheet in the next few weeks. These instructions provide you with information on caring for yourself after your procedure. Your health care provider may also give you more specific instructions. Your treatment has been planned according to current medical practices, but problems sometimes occur. Call your health care provider if you have any problems or questions after your procedure. What can I expect after the procedure? You may have some abdominal cramping, vaginal bleeding, and fatigue in the first few days after taking methotrexate. Some other possible side effects of methotrexate include:  Nausea.  Vomiting.  Diarrhea.  Mouth sores.  Swelling or irritation of the lining of your lungs (pneumonitis).  Liver damage.  Hair loss.  Follow these instructions at home: After you have received the methotrexate medicine, you need to be careful of your activities and watch your condition for several weeks. It may take 1 week before your hormone levels return to normal. Activity  Do not have sexual intercourse until your health care provider says it is safe to do so.  You may resume your usual diet.  Limit strenuous activity.  Do not drink alcohol. General instructions  Do not take aspirin, ibuprofen, or naproxen (nonsteroidal anti-inflammatory drugs [NSAIDs]).  Do not take folic acid, prenatal vitamins, or other vitamins that contain folic acid.  Avoid traveling too far away from your health care provider.  Keep all follow-up visits as told by your health care provider. This is important. Contact a health care provider if:  You cannot control your nausea and vomiting.  You cannot control your diarrhea.  You have sores in your mouth and want treatment.  You need pain medicine for your abdominal pain.  You have a rash.  You are having a reaction to the medicine. Get help right away if:  You have  increasing abdominal or pelvic pain.  You notice increased bleeding.  You feel light-headed, or you faint.  You have shortness of breath.  Your heart rate increases.  You have a cough.  You have chills.  You have a fever. This information is not intended to replace advice given to you by your health care provider. Make sure you discuss any questions you have with your health care provider. Document Released: 01/15/2011 Document Revised: 07/04/2015 Document Reviewed: 11/14/2012 Elsevier Interactive Patient Education  2017 [employer].

## 2016-09-28 NOTE — MAU Note (Signed)
Urine in lab

## 2016-10-01 ENCOUNTER — Ambulatory Visit: Payer: 59

## 2016-10-02 ENCOUNTER — Inpatient Hospital Stay (HOSPITAL_COMMUNITY)
Admission: AD | Admit: 2016-10-02 | Discharge: 2016-10-02 | Disposition: A | Discharge status: Home / Self Care | Payer: 59 | Source: Ambulatory Visit | Attending: Family Medicine | Admitting: Family Medicine

## 2016-10-02 ENCOUNTER — Ambulatory Visit: Payer: 59

## 2016-10-02 ENCOUNTER — Telehealth: Payer: Self-pay | Admitting: Family Medicine

## 2016-10-02 DIAGNOSIS — O00101 Right tubal pregnancy without intrauterine pregnancy: Secondary | ICD-10-CM | POA: Insufficient documentation

## 2016-10-02 DIAGNOSIS — O009 Unspecified ectopic pregnancy without intrauterine pregnancy: Secondary | ICD-10-CM | POA: Diagnosis present

## 2016-10-02 LAB — HCG, QUANTITATIVE, PREGNANCY: hCG, Beta Chain, Quant, S: 152 m[IU]/mL — ABNORMAL HIGH (ref <5)

## 2016-10-02 NOTE — MAU Note (Signed)
Pt presents for repeat BHCG following methotrexate injection on Monday August the 20th.Denies any VB, reports achy pain in right lower abdomen

## 2016-10-02 NOTE — Telephone Encounter (Signed)
Patient called said she could not make her 8:00am nurse Stat visit,  I spoke to Dr.Dove she said the patient need to return to MAU today and not to eat or Drink anything.Patient understood and said she will go to MAU today.     Lorriane Shire [place]

## 2016-10-02 NOTE — MAU Provider Note (Signed)
S: 41 y.o. [id number] @[redacted]w[redacted]d  by LMP presents to MAU for repeat hcg.  She denies abdominal pain or vaginal bleeding today.    Her quant hcg on 8/20 was 726 and ultrasound showed right adnexal mass so MTX was given for ectopic pregnancy  HPI  O: BP 129/78   Pulse 85   Temp 98.1 F (36.7 C)   Resp 18   LMP 08/16/2016   VS reviewed, nursing note reviewed,  Constitutional: well developed, well nourished, no distress HEENT: normocephalic CV: normal rate Pulm/chest wall: normal effort Abdomen: soft Neuro: alert and oriented x 3 Skin: warm, dry Psych: affect normal  Results for orders placed or performed during the hospital encounter of 10/02/16 (from the past 24 hour(s))  hCG, quantitative, pregnancy     Status: Abnormal   Collection Time: 10/02/16 11:48 AM  Result Value Ref Range   hCG, Beta Chain, Quant, S 152 (H) <5 mIU/mL       MDM: Ordered labs/reviewed results.  Quant dropped appropriately. Discussed results with pt.  Pt to return to MAU on Sunday for Day 7 MTX follow up hcg.  Ectopic precautions given and pt to return to MAU sooner if s/sx of ectopic, as ruptured ectopic can be life threatening.  Pt stable at time of discharge.  A: 1. Right tubal pregnancy without intrauterine pregnancy     P: D/C home with ectopic/bleeding precautions F/U with repeat labs as ordered Return to MAU as needed for emergencies  LEFTWICH-KIRBY, Preslei Blakley, CNM 2:18 PM

## 2016-10-04 ENCOUNTER — Inpatient Hospital Stay (HOSPITAL_COMMUNITY)
Admission: AD | Admit: 2016-10-04 | Discharge: 2016-10-04 | Disposition: A | Discharge status: Home / Self Care | Payer: 59 | Source: Ambulatory Visit | Attending: Obstetrics and Gynecology | Admitting: Obstetrics and Gynecology

## 2016-10-04 DIAGNOSIS — Z87898 Personal history of other specified conditions: Secondary | ICD-10-CM | POA: Insufficient documentation

## 2016-10-04 DIAGNOSIS — Z09 Encounter for follow-up examination after completed treatment for conditions other than malignant neoplasm: Secondary | ICD-10-CM | POA: Insufficient documentation

## 2016-10-04 DIAGNOSIS — R197 Diarrhea, unspecified: Secondary | ICD-10-CM | POA: Insufficient documentation

## 2016-10-04 DIAGNOSIS — Z79899 Other long term (current) drug therapy: Secondary | ICD-10-CM | POA: Diagnosis present

## 2016-10-04 DIAGNOSIS — Z048 Encounter for examination and observation for other specified reasons: Secondary | ICD-10-CM | POA: Insufficient documentation

## 2016-10-04 DIAGNOSIS — N939 Abnormal uterine and vaginal bleeding, unspecified: Secondary | ICD-10-CM | POA: Insufficient documentation

## 2016-10-04 DIAGNOSIS — O00101 Right tubal pregnancy without intrauterine pregnancy: Secondary | ICD-10-CM | POA: Diagnosis not present

## 2016-10-04 LAB — HCG, QUANTITATIVE, PREGNANCY: hCG, Beta Chain, Quant, S: 74 m[IU]/mL — ABNORMAL HIGH (ref <5)

## 2016-10-04 LAB — CBC
HCT: 38.1 % (ref 36.0–46.0)
Hemoglobin: 12.8 g/dL (ref 12.0–15.0)
MCH: 29.9 pg (ref 26.0–34.0)
MCHC: 33.6 g/dL (ref 30.0–36.0)
MCV: 89 fL (ref 78.0–100.0)
Platelets: 301 10*3/uL (ref 150–400)
RBC: 4.28 MIL/uL (ref 3.87–5.11)
RDW: 14.2 % (ref 11.5–15.5)
WBC: 8.7 10*3/uL (ref 4.0–10.5)

## 2016-10-04 NOTE — MAU Provider Note (Signed)
History   Chief Complaint:  Follow-up   Christina Weber is  41 y.o. [id number] Patient's last menstrual period was 08/16/2016.Marland Kitchen Patient is here for follow up of Day 7 quantitative HCG following methotrexate for right ectopic pregnancy and ongoing surveillance of pregnancy status.     Since her last visit, the patient is now having vaginal bleeding and is having right sided lower abdominal pain.  She is also having diarrhea.  She is managing her abdominal pain with Tylenol.  The patient reports bleeding as similar to a period.  General ROS: See HPI above  Her previous Quantitative HCG values are: 09-28-16  726  Diagnosed with right ectopic and given methotrexate 10-02-16  152    Physical Exam   Blood pressure 132/90, pulse 86, temperature 98.2 F (36.8 C), temperature source Oral, resp. rate 18, weight 180 lb 1.9 oz (81.7 kg), last menstrual period 08/16/2016, SpO2 99 %.  Exam: GENERAL: Well-developed, well-nourished female in no acute distress.  HEENT: Normocephalic, atraumatic.  LUNGS: Effort normal  HEART: Regular rate  SKIN: Warm, dry and without erythema  PSYCH: Normal mood and affect   Labs: Results for orders placed or performed during the hospital encounter of 10/04/16 (from the past 24 hour(s))  hCG, quantitative, pregnancy   Collection Time: 10/04/16  6:47 PM  Result Value Ref Range   hCG, Beta Chain, Quant, S 74 (H) <5 mIU/mL  CBC   Collection Time: 10/04/16  6:47 PM  Result Value Ref Range   WBC 8.7 4.0 - 10.5 K/uL   RBC 4.28 3.87 - 5.11 MIL/uL   Hemoglobin 12.8 12.0 - 15.0 g/dL   HCT 38.1 36.0 - 46.0 %   MCV 89.0 78.0 - 100.0 fL   MCH 29.9 26.0 - 34.0 pg   MCHC 33.6 30.0 - 36.0 g/dL   RDW 14.2 11.5 - 15.5 %   Platelets 301 150 - 400 K/uL    Ultrasound Studies:   US Ob Comp Less 14 Wks  Result Date: 09/19/2016 CLINICAL DATA:  Vaginal bleeding since 09/11/2016. 4 weeks and 6 days pregnant based on onset of previous menstrual period. EXAM: OBSTETRIC <14 WK  [place] AND TRANSVAGINAL OB US TECHNIQUE: Both transabdominal and transvaginal ultrasound examinations were performed for complete evaluation of the gestation as well as the maternal uterus, adnexal regions, and pelvic cul-de-sac. Transvaginal technique was performed to assess early pregnancy. COMPARISON:  None. FINDINGS: Intrauterine gestational sac: Not visualized Yolk sac:  Not visualized Embryo:  Not visualized Maternal uterus/adnexae: Right ovarian probable corpus luteum cyst. Nonvisualized left ovary. No free peritoneal fluid. IMPRESSION: 1. No intrauterine or extrauterine gestation visualized. 2. Nonvisualized left ovary. Electronically Signed   By: Claudie Revering M.D.   On: 09/19/2016 11:56   US Ob Transvaginal  Result Date: 09/28/2016 CLINICAL DATA:  Pregnant patient with vaginal bleeding. Right lower quadrant pain. EXAM: TRANSVAGINAL OB ULTRASOUND TECHNIQUE: Transvaginal ultrasound was performed for complete evaluation of the gestation as well as the maternal uterus, adnexal regions, and pelvic cul-de-sac. COMPARISON:  Pelvic ultrasound 03/22/2016 FINDINGS: Intrauterine gestational sac: None Yolk sac:  Not Visualized. Embryo:  Not Visualized. Cardiac Activity: Not Visualized. Subchorionic hemorrhage:  None visualized. Maternal uterus/adnexae: Normal appearance of the left ovary. There is a cyst within the right ovary. Within the right adnexa there is a 2 cm solid mass with vascular flow. Trace pelvic free fluid. IMPRESSION: Right adnexal mass concerning for right tubal ectopic pregnancy. No intrauterine gestation identified. Critical Value/emergent results were called by telephone at the  time of interpretation on 09/28/2016 at 10:15 am to Dr. Anderson [facility] , who verbally acknowledged these results. Electronically Signed   By: Lovey Newcomer M.D.   On: 09/28/2016 10:11   US Ob Transvaginal  Result Date: 09/19/2016 CLINICAL DATA:  Vaginal bleeding since 09/11/2016. 4 weeks and 6 days pregnant based on onset of  previous menstrual period. EXAM: OBSTETRIC <14 WK [place] AND TRANSVAGINAL OB US TECHNIQUE: Both transabdominal and transvaginal ultrasound examinations were performed for complete evaluation of the gestation as well as the maternal uterus, adnexal regions, and pelvic cul-de-sac. Transvaginal technique was performed to assess early pregnancy. COMPARISON:  None. FINDINGS: Intrauterine gestational sac: Not visualized Yolk sac:  Not visualized Embryo:  Not visualized Maternal uterus/adnexae: Right ovarian probable corpus luteum cyst. Nonvisualized left ovary. No free peritoneal fluid. IMPRESSION: 1. No intrauterine or extrauterine gestation visualized. 2. Nonvisualized left ovary. Electronically Signed   By: Claudie Revering M.D.   On: 09/19/2016 11:56    Assessment: Ectopic pregnancy treated with methotrexate on 09-28-16 with falling quants  Plan: Consult with Dr. Glo Herring due to client's bleeding and symptoms.  Checked CBC.  HGB is stable which most likely is indicative of an expected shedding of the uterine lining and likely confirms there is not bleeding into the abdomen. The patient is instructed to follow up in as scheduled by clinic for repeat quant likely on 10-13-16.  Message sent to the clinic to call patient and schedule. No heavy lifting - note given to be out of work until next labs are done. Advised again - pelvic rest and to return ASAP with worsening abdominal pain.  Current pain is still not as severe as before the methotrexate was given. Client in agreement with this plan.  [name] 10/04/2016, 7:37 PM

## 2016-10-04 NOTE — MAU Note (Addendum)
Patient here for day 7 methotrexate follow up HCG.  +vaginal bleeding Started Friday--states like a period now  +diarrhea since Friday Episodes diarrhea x2 today  +lower abdominal cramping Predominately on the right side Rating 5-6/10 Taken tylenol throughout the day

## 2016-10-05 ENCOUNTER — Ambulatory Visit: Payer: 59

## 2016-10-07 ENCOUNTER — Encounter: Payer: Self-pay | Admitting: General Practice

## 2016-10-07 NOTE — Progress Notes (Unsigned)
Late entry for 10/05/16. Patient no showed for stat bhcg appt. Per chart review, patient went to MAU on 10/04/16 and was treated for presumed ectopic with MTX. Bhcg not indicated today

## 2016-10-08 ENCOUNTER — Telehealth: Payer: Self-pay | Admitting: General Practice

## 2016-10-08 NOTE — Telephone Encounter (Signed)
Called and notified patient in regards to lab visit.  Patient is scheduled for 10/13/16 at 11:30am.  Patient voiced understanding.

## 2016-10-13 ENCOUNTER — Encounter: Payer: Self-pay | Admitting: General Practice

## 2016-10-13 ENCOUNTER — Other Ambulatory Visit: Payer: BLUE CROSS/BLUE SHIELD

## 2016-10-13 DIAGNOSIS — O00109 Unspecified tubal pregnancy without intrauterine pregnancy: Secondary | ICD-10-CM

## 2016-10-14 LAB — BETA HCG QUANT (REF LAB): hCG Quant: 2 m[IU]/mL

## 2017-01-14 ENCOUNTER — Ambulatory Visit: Payer: BLUE CROSS/BLUE SHIELD | Admitting: Student

## 2017-02-03 ENCOUNTER — Encounter: Payer: Self-pay | Admitting: Student

## 2017-02-09 ENCOUNTER — Encounter: Payer: Self-pay | Admitting: Student

## 2017-02-10 ENCOUNTER — Encounter: Payer: BLUE CROSS/BLUE SHIELD | Admitting: Student

## 2017-03-23 ENCOUNTER — Encounter: Payer: Self-pay | Admitting: Family Medicine

## 2017-03-23 ENCOUNTER — Ambulatory Visit (INDEPENDENT_AMBULATORY_CARE_PROVIDER_SITE_OTHER): Payer: BLUE CROSS/BLUE SHIELD | Admitting: Family Medicine

## 2017-03-23 ENCOUNTER — Other Ambulatory Visit (HOSPITAL_COMMUNITY)
Admission: RE | Admit: 2017-03-23 | Discharge: 2017-03-23 | Disposition: A | Discharge status: Home / Self Care | Payer: BLUE CROSS/BLUE SHIELD | Source: Ambulatory Visit | Attending: Family Medicine | Admitting: Family Medicine

## 2017-03-23 VITALS — BP 130/97 | HR 93 | Wt 189.0 lb

## 2017-03-23 DIAGNOSIS — Z114 Encounter for screening for human immunodeficiency virus [HIV]: Secondary | ICD-10-CM

## 2017-03-23 DIAGNOSIS — Z3202 Encounter for pregnancy test, result negative: Secondary | ICD-10-CM | POA: Diagnosis not present

## 2017-03-23 DIAGNOSIS — Z124 Encounter for screening for malignant neoplasm of cervix: Secondary | ICD-10-CM

## 2017-03-23 DIAGNOSIS — N898 Other specified noninflammatory disorders of vagina: Secondary | ICD-10-CM

## 2017-03-23 DIAGNOSIS — N760 Acute vaginitis: Secondary | ICD-10-CM | POA: Insufficient documentation

## 2017-03-23 DIAGNOSIS — B9689 Other specified bacterial agents as the cause of diseases classified elsewhere: Secondary | ICD-10-CM | POA: Insufficient documentation

## 2017-03-23 DIAGNOSIS — R030 Elevated blood-pressure reading, without diagnosis of hypertension: Secondary | ICD-10-CM

## 2017-03-23 DIAGNOSIS — E669 Obesity, unspecified: Secondary | ICD-10-CM

## 2017-03-23 DIAGNOSIS — Z23 Encounter for immunization: Secondary | ICD-10-CM | POA: Diagnosis not present

## 2017-03-23 DIAGNOSIS — Z113 Encounter for screening for infections with a predominantly sexual mode of transmission: Secondary | ICD-10-CM | POA: Insufficient documentation

## 2017-03-23 NOTE — Progress Notes (Signed)
Pt stated last pap smear---2015 --[facility]                       Mammogram--2017--breast cancer imaging Pt requesting pap smear (yearly exam) and pregnancy test

## 2017-03-23 NOTE — Progress Notes (Signed)
GYNECOLOGY OFFICE VISIT NOTE  History:  42 y.o. [id number] here today for pap smear. Also requests testing for STDs including HIV. She does report a vaginal discharge more than just her phyiologic discharge. She denies any vaginal odor or itching, bleeding, pelvic pain or other concerns.    Gyn history; Patient's last menstrual period was 03/06/2017 (exact date). Menses regular q 24-26 days Not on contraception, and reports she wishes to have another baby  OB History  Gravida Para Term Preterm AB Living  7 4 2 2 3 4   SAB TAB Ectopic Multiple Live Births    1 2   4     # Outcome Date GA Lbr Len/2nd Weight Sex Delivery Anes PTL Lv  7 Ectopic 09/2016          6 Term 01/26/13 [id number]:36 / 00:14 6 lb 10.5 oz (3.02 kg) F Vag-Spont EPI  LIV  5 TAB 11/20/05             Birth Comments: Baby had heart problems at early gestation  4 Preterm 02/16/97    F Vag-Spont   LIV  3 Term 10/08/95    F Vag-Spont   LIV  2 Preterm 01/09/94    M Vag-Spont   LIV  1 Ectopic 09/16/91               Past Medical History:  Diagnosis Date  . Abnormal Pap smear   . Breast abscess    right breast  . Complication of anesthesia    ? states caused umbilical area to harden  . HPV in female 2010  . Kidney stone    pyelo  . Psoriasis     Past Surgical History:  Procedure Laterality Date  . BREAST CYST ASPIRATION    . DILATION AND CURETTAGE OF UTERUS    . DILATION AND EVACUATION N/A 04/05/2014   Procedure: DILATATION AND EVACUATION;  Surgeon: JDarlyn Chamber MD;  [facility];  Service: Gynecology;  Laterality: N/A;  . LAPAROSCOPY  03/2001  . LEEP      The following portions of the patient's history were reviewed and updated as appropriate: allergies, current medications, past family history, past medical history, past social history, past surgical history and problem list.   Health Maintenance: Last pap smear in 2015, normal per patient.  Normal mammogram in 2017.   Review of Systems:  Pertinent  items noted in HPI and remainder of comprehensive ROS otherwise negative.   Objective:  Physical Exam BP (!) 130/97   Pulse 93   Wt 189 lb (85.7 kg)   LMP 03/06/2017 (Exact Date)   BMI 34.02 kg/m  CONSTITUTIONAL: Well-developed, well-nourished female in no acute distress.  HENT:  Normocephalic, atraumatic. External right and left ear normal. Oropharynx is clear and moist EYES: Conjunctivae and EOM are normal. Pupils are equal, round, and reactive to light. No scleral icterus.  NECK: Normal range of motion, supple, no masses SKIN: Skin is warm and dry. No rash noted. Not diaphoretic. No erythema. No pallor. NEUROLOGIC: Alert and oriented to person, place, and time. Normal reflexes, muscle tone coordination. No cranial nerve deficit noted. PSYCHIATRIC: Normal mood and affect. Normal behavior. Normal judgment and thought content. BREAST: Normal to palpation throughout. No skin changes. No nipple discharge. No axillary LAD CARDIOVASCULAR: Normal heart rate; regular rhythm; no murmurs/rubs/gallops RESPIRATORY: Effort and breath sounds normal, lungs CTAB ABDOMEN: Soft, no distention noted.   PELVIC: Normal appearing external genitalia; normal appearing vaginal mucosa and cervix.  No abnormal discharge noted.  Normal uterine size, no other palpable masses, no uterine or adnexal tenderness. MUSCULOSKELETAL: Normal range of motion. No edema noted.   Assessment & Plan:  1. Encounter for Papanicolaou smear for cervical cancer screening - Pap smear with co-testing collected  2. Screen for STD (sexually transmitted disease) - GC/Chlamydia, trichomonas testing collected with pap smear - HIV antibody (with reflex) - RPR  3. Screening for HIV (human immunodeficiency virus) Pt verbally consented to testing - HIV antibody (with reflex)  4. Vaginal discharge - Cultures collected  5. Influenza vaccination given  6. Elevated BP without diagnosis of hypertension BP Readings from Last 3  Encounters:  03/23/17 (!) 130/97  10/04/16 132/90  10/02/16 129/78  -Counseled patient on nonpharmacological measures to improve blood pressure, including low sodium-diet, regular exercise and weight loss.  Routine preventative health maintenance measures emphasized. Please refer to After Visit Summary for other counseling recommendations.    Almyra Free P. Degele, MD [facility] [employer] [facility]

## 2017-03-23 NOTE — Patient Instructions (Signed)
DASH Eating Plan DASH stands for "Dietary Approaches to Stop Hypertension." The DASH eating plan is a healthy eating plan that has been shown to reduce high blood pressure (hypertension). It may also reduce your risk for type 2 diabetes, heart disease, and stroke. The DASH eating plan may also help with weight loss. What are tips for following this plan? General guidelines  Avoid eating more than 2,300 mg (milligrams) of salt (sodium) a day. If you have hypertension, you may need to reduce your sodium intake to 1,500 mg a day.  Limit alcohol intake to no more than 1 drink a day for nonpregnant women and 2 drinks a day for men. One drink equals 12 oz of beer, 5 oz of wine, or 1 oz of hard liquor.  Work with your health care provider to maintain a healthy body weight or to lose weight. Ask what an ideal weight is for you.  Get at least 30 minutes of exercise that causes your heart to beat faster (aerobic exercise) most days of the week. Activities may include walking, swimming, or biking.  Work with your health care provider or diet and nutrition specialist (dietitian) to adjust your eating plan to your individual calorie needs. Reading food labels  Check food labels for the amount of sodium per serving. Choose foods with less than 5 percent of the Daily Value of sodium. Generally, foods with less than 300 mg of sodium per serving fit into this eating plan.  To find whole grains, look for the word "whole" as the first word in the ingredient list. Shopping  Buy products labeled as "low-sodium" or "no salt added."  Buy fresh foods. Avoid canned foods and premade or frozen meals. Cooking  Avoid adding salt when cooking. Use salt-free seasonings or herbs instead of table salt or sea salt. Check with your health care provider or pharmacist before using salt substitutes.  Do not fry foods. Cook foods using healthy methods such as baking, boiling, grilling, and broiling instead.  Cook with  heart-healthy oils, such as olive, canola, soybean, or sunflower oil. Meal planning   Eat a balanced diet that includes: ? 5 or more servings of fruits and vegetables each day. At each meal, try to fill half of your plate with fruits and vegetables. ? Up to 6-8 servings of whole grains each day. ? Less than 6 oz of lean meat, poultry, or fish each day. A 3-oz serving of meat is about the same size as a deck of cards. One egg equals 1 oz. ? 2 servings of low-fat dairy each day. ? A serving of nuts, seeds, or beans 5 times each week. ? Heart-healthy fats. Healthy fats called Omega-3 fatty acids are found in foods such as flaxseeds and coldwater fish, like sardines, salmon, and mackerel.  Limit how much you eat of the following: ? Canned or prepackaged foods. ? Food that is high in trans fat, such as fried foods. ? Food that is high in saturated fat, such as fatty meat. ? Sweets, desserts, sugary drinks, and other foods with added sugar. ? Full-fat dairy products.  Do not salt foods before eating.  Try to eat at least 2 vegetarian meals each week.  Eat more home-cooked food and less restaurant, buffet, and fast food.  When eating at a restaurant, ask that your food be prepared with less salt or no salt, if possible. What foods are recommended? The items listed may not be a complete list. Talk with your dietitian about what   dietary choices are best for you. Grains Whole-grain or whole-wheat bread. Whole-grain or whole-wheat pasta. Brown rice. Oatmeal. Quinoa. Bulgur. Whole-grain and low-sodium cereals. Pita bread. Low-fat, low-sodium crackers. Whole-wheat flour tortillas. Vegetables Fresh or frozen vegetables (raw, steamed, roasted, or grilled). Low-sodium or reduced-sodium tomato and vegetable juice. Low-sodium or reduced-sodium tomato sauce and tomato paste. Low-sodium or reduced-sodium canned vegetables. Fruits All fresh, dried, or frozen fruit. Canned fruit in natural juice (without  added sugar). Meat and other protein foods Skinless chicken or turkey. Ground chicken or turkey. Pork with fat trimmed off. Fish and seafood. Egg whites. Dried beans, peas, or lentils. Unsalted nuts, nut butters, and seeds. Unsalted canned beans. Lean cuts of beef with fat trimmed off. Low-sodium, lean deli meat. Dairy Low-fat (1%) or fat-free (skim) milk. Fat-free, low-fat, or reduced-fat cheeses. Nonfat, low-sodium ricotta or cottage cheese. Low-fat or nonfat yogurt. Low-fat, low-sodium cheese. Fats and oils Soft margarine without trans fats. Vegetable oil. Low-fat, reduced-fat, or light mayonnaise and salad dressings (reduced-sodium). Canola, safflower, olive, soybean, and sunflower oils. Avocado. Seasoning and other foods Herbs. Spices. Seasoning mixes without salt. Unsalted popcorn and pretzels. Fat-free sweets. What foods are not recommended? The items listed may not be a complete list. Talk with your dietitian about what dietary choices are best for you. Grains Baked goods made with fat, such as croissants, muffins, or some breads. Dry pasta or rice meal packs. Vegetables Creamed or fried vegetables. Vegetables in a cheese sauce. Regular canned vegetables (not low-sodium or reduced-sodium). Regular canned tomato sauce and paste (not low-sodium or reduced-sodium). Regular tomato and vegetable juice (not low-sodium or reduced-sodium). Pickles. Olives. Fruits Canned fruit in a light or heavy syrup. Fried fruit. Fruit in cream or butter sauce. Meat and other protein foods Fatty cuts of meat. Ribs. Fried meat. Bacon. Sausage. Bologna and other processed lunch meats. Salami. Fatback. Hotdogs. Bratwurst. Salted nuts and seeds. Canned beans with added salt. Canned or smoked fish. Whole eggs or egg yolks. Chicken or turkey with skin. Dairy Whole or 2% milk, cream, and half-and-half. Whole or full-fat cream cheese. Whole-fat or sweetened yogurt. Full-fat cheese. Nondairy creamers. Whipped toppings.  Processed cheese and cheese spreads. Fats and oils Butter. Stick margarine. Lard. Shortening. Ghee. Bacon fat. Tropical oils, such as coconut, palm kernel, or palm oil. Seasoning and other foods Salted popcorn and pretzels. Onion salt, garlic salt, seasoned salt, table salt, and sea salt. Worcestershire sauce. Tartar sauce. Barbecue sauce. Teriyaki sauce. Soy sauce, including reduced-sodium. Steak sauce. Canned and packaged gravies. Fish sauce. Oyster sauce. Cocktail sauce. Horseradish that you find on the shelf. Ketchup. Mustard. Meat flavorings and tenderizers. Bouillon cubes. Hot sauce and Tabasco sauce. Premade or packaged marinades. Premade or packaged taco seasonings. Relishes. Regular salad dressings. Where to find more information:  National Heart, Lung, and Blood Institute: www.nhlbi.nih.gov  American Heart Association: www.heart.org Summary  The DASH eating plan is a healthy eating plan that has been shown to reduce high blood pressure (hypertension). It may also reduce your risk for type 2 diabetes, heart disease, and stroke.  With the DASH eating plan, you should limit salt (sodium) intake to 2,300 mg a day. If you have hypertension, you may need to reduce your sodium intake to 1,500 mg a day.  When on the DASH eating plan, aim to eat more fresh fruits and vegetables, whole grains, lean proteins, low-fat dairy, and heart-healthy fats.  Work with your health care provider or diet and nutrition specialist (dietitian) to adjust your eating plan to your individual   calorie needs. This information is not intended to replace advice given to you by your health care provider. Make sure you discuss any questions you have with your health care provider. Document Released: 01/15/2011 Document Revised: 01/20/2016 Document Reviewed: 01/20/2016 Elsevier Interactive Patient Education  [employer].

## 2017-03-24 LAB — RPR: RPR Ser Ql: NONREACTIVE

## 2017-03-24 LAB — CERVICOVAGINAL ANCILLARY ONLY
Bacterial vaginitis: POSITIVE — AB
Candida vaginitis: NEGATIVE
Chlamydia: NEGATIVE
Neisseria Gonorrhea: NEGATIVE
Trichomonas: NEGATIVE

## 2017-03-24 LAB — HIV ANTIBODY (ROUTINE TESTING W REFLEX): HIV Screen 4th Generation wRfx: NONREACTIVE

## 2017-03-24 LAB — HEPATITIS B SURFACE ANTIBODY, QUANTITATIVE: Hepatitis B Surf Ab Quant: <3.1 m[IU]/mL — ABNORMAL LOW (ref >9.9)

## 2017-03-24 LAB — POCT PREGNANCY, URINE: Preg Test, Ur: NEGATIVE

## 2017-03-25 ENCOUNTER — Encounter: Payer: Self-pay | Admitting: Family Medicine

## 2017-03-26 ENCOUNTER — Encounter: Payer: Self-pay | Admitting: General Practice

## 2017-03-31 ENCOUNTER — Other Ambulatory Visit: Payer: Self-pay | Admitting: *Deleted

## 2017-03-31 DIAGNOSIS — B9689 Other specified bacterial agents as the cause of diseases classified elsewhere: Secondary | ICD-10-CM

## 2017-03-31 DIAGNOSIS — N760 Acute vaginitis: Principal | ICD-10-CM

## 2017-03-31 MED ORDER — METRONIDAZOLE 500 MG PO TABS: 500.0000 mg | ORAL_TABLET | Freq: Two times a day (BID) | ORAL | 0 refills | Status: DC

## 2017-04-23 ENCOUNTER — Other Ambulatory Visit: Payer: Self-pay | Admitting: Family Medicine

## 2017-04-23 DIAGNOSIS — Z139 Encounter for screening, unspecified: Secondary | ICD-10-CM

## 2017-05-28 ENCOUNTER — Ambulatory Visit: Payer: BLUE CROSS/BLUE SHIELD

## 2017-06-28 ENCOUNTER — Ambulatory Visit: Payer: BLUE CROSS/BLUE SHIELD

## 2017-07-21 ENCOUNTER — Ambulatory Visit
Admission: RE | Admit: 2017-07-21 | Discharge: 2017-07-21 | Disposition: A | Discharge status: Home / Self Care | Payer: BLUE CROSS/BLUE SHIELD | Source: Ambulatory Visit | Attending: Family Medicine | Admitting: Family Medicine

## 2017-07-21 DIAGNOSIS — Z139 Encounter for screening, unspecified: Secondary | ICD-10-CM

## 2017-09-11 ENCOUNTER — Other Ambulatory Visit: Payer: Self-pay

## 2017-09-11 ENCOUNTER — Emergency Department (HOSPITAL_COMMUNITY)
Admission: EM | Admit: 2017-09-11 | Discharge: 2017-09-11 | Disposition: A | Discharge status: Home / Self Care | Payer: BLUE CROSS/BLUE SHIELD | Attending: Emergency Medicine | Admitting: Emergency Medicine

## 2017-09-11 ENCOUNTER — Encounter (HOSPITAL_COMMUNITY): Payer: Self-pay

## 2017-09-11 DIAGNOSIS — Z87891 Personal history of nicotine dependence: Secondary | ICD-10-CM | POA: Diagnosis not present

## 2017-09-11 DIAGNOSIS — M542 Cervicalgia: Secondary | ICD-10-CM | POA: Diagnosis present

## 2017-09-11 DIAGNOSIS — Z79899 Other long term (current) drug therapy: Secondary | ICD-10-CM | POA: Diagnosis not present

## 2017-09-11 DIAGNOSIS — M62838 Other muscle spasm: Secondary | ICD-10-CM

## 2017-09-11 MED ORDER — IBUPROFEN 200 MG PO TABS
600.0000 mg | ORAL_TABLET | Freq: Once | ORAL | Status: AC
  Administered 2017-09-11: 600 mg via ORAL
  Filled 2017-09-11: qty 3

## 2017-09-11 MED ORDER — METHOCARBAMOL 500 MG PO TABS: 500.0000 mg | ORAL_TABLET | Freq: Two times a day (BID) | ORAL | 0 refills | Status: DC

## 2017-09-11 NOTE — ED Triage Notes (Signed)
He c/o non-traumatic neck pain/stiffness. She is in no distress.

## 2017-09-11 NOTE — Discharge Instructions (Addendum)
You may alternate taking Tylenol and Ibuprofen as needed for pain control. You may take 400-600 mg of ibuprofen every 6 hours and [phone] mg of Tylenol every 6 hours. Do not exceed 4000 mg of Tylenol daily as this can lead to liver damage. Also, make sure to take Ibuprofen with meals as it can cause an upset stomach. Do not take other NSAIDs while taking Ibuprofen such as (Aleve, Naprosyn, Aspirin, Celebrex, etc) and do not take more than the prescribed dose as this can lead to ulcers and bleeding in your GI tract. You may use warm and cold compresses to help with your symptoms.   You were given a prescription for Robaxin which is a muscle relaxer.  You should not drive, work, or operate machinery while taking this medication as it can make you very drowsy.    Please follow up with your primary doctor within the next 7-10 days for re-evaluation and further treatment of your symptoms.   Please return to the ER sooner if you have any new or worsening symptoms.

## 2017-09-11 NOTE — ED Provider Notes (Signed)
[facility] DEPT Provider Note   CSN: [id number] Arrival date & time: 09/11/17  1558     History   Chief Complaint Chief Complaint  Patient presents with  . Neck Pain    HPI Christina Weber is a 42 y.o. female.  HPI   Pt isa 42 y/o female with a h/o kidney stone who presents to the ED today c/o right sided neck and shoulder pain that began several months ago. States pain has been intermittent. Pain radiates down her RUE. States pain feels like burning, and aching. States is feels like a bundle of nerves to her RUE. Pain rated at "11"/10. 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 States she is right handed. Reports intermittent tingling sensation to her forearm and fingers.   No chest pain, shortness of breath, fevers, persistent numbness, weakness, swelling, or redness to the arm.  Past Medical History:  Diagnosis Date  . Abnormal Pap smear   . Breast abscess    right breast  . Complication of anesthesia    ? states caused umbilical area to harden  . HPV in female 2010  . Kidney stone    pyelo  . Psoriasis     Patient Active Problem List   Diagnosis Date Noted  . Incomplete spontaneous abortion 04/05/2014    Class: Present on Admission  . Breast abscess-right 02/28/2013  . Active labor 01/26/2013    Past Surgical History:  Procedure Laterality Date  . BREAST CYST ASPIRATION    . DILATION AND CURETTAGE OF UTERUS    . DILATION AND EVACUATION N/A 04/05/2014   Procedure: DILATATION AND EVACUATION;  Surgeon: Darlyn Chamber, MD;  Location: [facility];  Service: Gynecology;  Laterality: N/A;  . LAPAROSCOPY  03/2001  . LEEP       OB History    Gravida  7   Para  4   Term  2   Preterm  2   AB  3   Living  4     SAB      TAB  1   Ectopic  2   Multiple      Live Births  4            Home  Medications    Prior to Admission medications   Medication Sig Start Date End Date Taking? Authorizing Provider  calcium carbonate (OSCAL) 1500 (600 Ca) MG TABS tablet Take 600 mg of elemental calcium by mouth 2 (two) times daily with a meal.    [provider]  ferrous sulfate 325 (65 FE) MG tablet Take 325 mg by mouth daily with breakfast.    [provider]  methocarbamol (ROBAXIN) 500 MG tablet Take 1 tablet (500 mg total) by mouth 2 (two) times daily. 09/11/17   Brynli Ollis S, PA-C  metroNIDAZOLE (FLAGYL) 500 MG tablet Take 1 tablet (500 mg total) by mouth 2 (two) times daily. 03/31/17   Degele, Jenne Pane, MD  Misc Natural Products (TURMERIC CURCUMIN) CAPS Take 1 capsule by mouth daily.    [provider]  vitamin B-12 (CYANOCOBALAMIN) 1000 MCG tablet Take 1,000 mcg by mouth daily.    [provider]  vitamin E (VITAMIN E) 400 UNIT capsule Take 400 Units by mouth daily.    [provider]    Family History Family History  Problem Relation Age of Onset  . Cancer Mother        brain  . Hypertension Father   . Cancer Father        lymph nodes  . Diabetes Paternal Grandmother   . Hypertension Paternal Grandmother   . Hearing loss Neg Hx     Social History Social History   Tobacco Use  . Smoking status: Former Smoker    Types: Cigarettes    Last attempt to quit: 05/25/2012    Years since quitting: 5.3  . Smokeless tobacco: Never Used  Substance Use Topics  . Alcohol use: Yes    Comment: occasionally  . Drug use: No     Allergies   Latex   Review of Systems Review of Systems  Constitutional: Negative for fever.  Musculoskeletal: Positive for neck pain.       Right arm pain  Skin: Negative for wound.  Neurological: Negative for weakness and numbness.     Physical Exam Updated Vital Signs BP (!) 154/92 (BP Location: Left Arm)   Pulse 76   Temp 98.4 F (36.9 C) (Oral)   Resp 16   SpO2 99%   Physical Exam    Constitutional: She appears well-developed and well-nourished. No distress.  HENT:  Head: Normocephalic and atraumatic.  Eyes: Conjunctivae are normal.  Neck: Neck supple.  Cardiovascular: Normal rate and regular rhythm.  No murmur heard. Pulmonary/Chest: Effort normal and breath sounds normal. No stridor. No respiratory distress. She has no wheezes.  Abdominal: Soft. There is no tenderness.  Musculoskeletal: Normal range of motion.  Tenderness to palpation to the right cervical paraspinous muscles, right trapezius and right deltoid muscle with muscle spasm.  No erythema or swelling to the right upper extremity.  Has full range of motion.  Neurological: She is alert.  5/5 strength to the bilateral upper and lower extremity's.  Normal sensation throughout.  Skin: Skin is warm and dry. Capillary refill takes less than 2 seconds.  Psychiatric: She has a normal mood and affect.  Nursing note and vitals reviewed.    ED Treatments / Results  Labs (all labs ordered are listed, but only abnormal results are displayed) Labs Reviewed - No data to display  EKG None  Radiology No results found.  Procedures Procedures (including critical care time)  Medications Ordered in ED Medications - No data to display   Initial Impression / Assessment and Plan / ED Course  I have reviewed the triage vital signs and the nursing notes.  Pertinent labs & imaging results that were available during my care of the patient were reviewed by me and considered in my medical decision making (see chart for details).      Final Clinical Impressions(s) / ED Diagnoses   Final diagnoses:  Muscle spasms of neck   Patient complaining of right-sided neck pain, trapezius pain and right upper extremity pain that has been ongoing intermittently for the last several months but seems to have worsened over the last few days.  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  She has muscle spasms on exam in these areas that reproduce her pain.  May also have an underlying cervical radiculopathy affecting her symptoms.  No neuro deficits.  Do not suspect any emergent etiology to explain her symptoms.  We will treat her with muscle relaxer and have her follow-up with her PCP for symptoms not improved.  Discussed signs and symptoms  that would cause her to return immediately to the ED.  She voiced understanding of plan reasons to return to the ED.  All questions answered.  ED Discharge Orders        Ordered    methocarbamol (ROBAXIN) 500 MG tablet  2 times daily     09/11/17 1932       Rodney Booze, PA-C 09/11/17 [place], [place], DO 09/11/17 [id number]

## 2017-09-13 ENCOUNTER — Encounter (HOSPITAL_COMMUNITY): Payer: Self-pay

## 2017-09-13 ENCOUNTER — Emergency Department (HOSPITAL_COMMUNITY)
Admission: EM | Admit: 2017-09-13 | Discharge: 2017-09-13 | Disposition: A | Discharge status: Home / Self Care | Payer: BLUE CROSS/BLUE SHIELD | Attending: Emergency Medicine | Admitting: Emergency Medicine

## 2017-09-13 ENCOUNTER — Other Ambulatory Visit: Payer: Self-pay

## 2017-09-13 ENCOUNTER — Emergency Department (HOSPITAL_COMMUNITY): Payer: BLUE CROSS/BLUE SHIELD

## 2017-09-13 DIAGNOSIS — R202 Paresthesia of skin: Secondary | ICD-10-CM | POA: Insufficient documentation

## 2017-09-13 DIAGNOSIS — M79601 Pain in right arm: Secondary | ICD-10-CM | POA: Insufficient documentation

## 2017-09-13 DIAGNOSIS — Z87891 Personal history of nicotine dependence: Secondary | ICD-10-CM | POA: Insufficient documentation

## 2017-09-13 DIAGNOSIS — Z79899 Other long term (current) drug therapy: Secondary | ICD-10-CM | POA: Insufficient documentation

## 2017-09-13 DIAGNOSIS — Z91040 Latex allergy status: Secondary | ICD-10-CM | POA: Insufficient documentation

## 2017-09-13 LAB — POC URINE PREG, ED: Preg Test, Ur: NEGATIVE

## 2017-09-13 LAB — I-STAT CREATININE, ED: Creatinine, Ser: 0.5 mg/dL (ref 0.44–1.00)

## 2017-09-13 MED ORDER — LIDOCAINE 5 % EX PTCH
1.0000 | MEDICATED_PATCH | CUTANEOUS | Status: DC
  Administered 2017-09-13: 1 via TRANSDERMAL
  Filled 2017-09-13: qty 1

## 2017-09-13 MED ORDER — HYDROCODONE-ACETAMINOPHEN 5-325 MG PO TABS: 1.0000 | ORAL_TABLET | ORAL | 0 refills | Status: DC | PRN

## 2017-09-13 MED ORDER — KETOROLAC TROMETHAMINE 15 MG/ML IJ SOLN
15.0000 mg | Freq: Once | INTRAMUSCULAR | Status: AC
  Administered 2017-09-13: 15 mg via INTRAMUSCULAR
  Filled 2017-09-13: qty 1

## 2017-09-13 MED ORDER — METHYLPREDNISOLONE 4 MG PO TBPK: ORAL_TABLET | ORAL | 0 refills | Status: DC

## 2017-09-13 MED ORDER — OXYCODONE-ACETAMINOPHEN 5-325 MG PO TABS
1.0000 | ORAL_TABLET | Freq: Once | ORAL | Status: AC
  Administered 2017-09-13: 1 via ORAL
  Filled 2017-09-13: qty 1

## 2017-09-13 MED ORDER — DICLOFENAC SODIUM 1 % TD GEL: 2.0000 g | Freq: Four times a day (QID) | TRANSDERMAL | 0 refills | Status: DC

## 2017-09-13 NOTE — ED Triage Notes (Signed)
Pt reports shooting R arm pain. She states that it started tonight when she raised her arm. She was seen 8/3 for neck pain that radiated to her R shoulder and arm. A&Ox4. She states that she is unable to move her arm d/t pain. Able to move fingers. No discoloration noted.

## 2017-09-13 NOTE — ED Provider Notes (Signed)
[facility] DEPT Provider Note   CSN: [id number] Arrival date & time: 09/13/17  0145     History   Chief Complaint Chief Complaint  Patient presents with  . Arm Pain    HPI Christina Weber is a 42 y.o. female.  HPI 42 year old Caucasian female with no pertinent past medical history presents to the emergency department today for evaluation of right arm pain and paresthesias.  The patient states that earlier this evening she went to raise her arm and felt severe pain in her right shoulder that radiates down her right arm.  Reports associated paresthesias.  Patient states the pain is very severe that she cannot move.  She states that she was seen on 8/3 in the ED for neck pain and right shoulder pain at that time.  They gave her anti-inflammatories and muscle relaxers.  States this is not helping.  Patient does report history of the cervical disc issues in the past.  She states that she is taken Tylenol and Aleve earlier today without any relief of her pain.  Denies any associated swelling.  Denies any recent injuries.  Denies any severe neck pain.  Denies any headache or vision changes.  Denies any chest pain or shortness of breath.  Nothing makes her symptoms better.  Range of motion and palpation makes the pain worse.     Past Medical History:  Diagnosis Date  . Abnormal Pap smear   . Breast abscess    right breast  . Complication of anesthesia    ? states caused umbilical area to harden  . HPV in female 2010  . Kidney stone    pyelo  . Psoriasis     Patient Active Problem List   Diagnosis Date Noted  . Incomplete spontaneous abortion 04/05/2014    Class: Present on Admission  . Breast abscess-right 02/28/2013  . Active labor 01/26/2013    Past Surgical History:  Procedure Laterality Date  . BREAST CYST ASPIRATION    . DILATION AND CURETTAGE OF UTERUS    . DILATION AND EVACUATION N/A 04/05/2014   Procedure: DILATATION AND EVACUATION;   Surgeon: Darlyn Chamber, MD;  Location: [facility];  Service: Gynecology;  Laterality: N/A;  . LAPAROSCOPY  03/2001  . LEEP       OB History    Gravida  7   Para  4   Term  2   Preterm  2   AB  3   Living  4     SAB      TAB  1   Ectopic  2   Multiple      Live Births  4            Home Medications    Prior to Admission medications   Medication Sig Start Date End Date Taking? Authorizing Provider  calcium carbonate (OSCAL) 1500 (600 Ca) MG TABS tablet Take 600 mg of elemental calcium by mouth 2 (two) times daily with a meal.    [provider]  ferrous sulfate 325 (65 FE) MG tablet Take 325 mg by mouth daily with breakfast.    [provider]  methocarbamol (ROBAXIN) 500 MG tablet Take 1 tablet (500 mg total) by mouth 2 (two) times daily. 09/11/17   Couture, Cortni S, PA-C  metroNIDAZOLE (FLAGYL) 500 MG tablet Take 1 tablet (500 mg total) by mouth 2 (two) times daily. 03/31/17   Degele, Jenne Pane, MD  Misc Natural Products ([place]) CAPS  Take 1 capsule by mouth daily.    [provider]  vitamin B-12 (CYANOCOBALAMIN) 1000 MCG tablet Take 1,000 mcg by mouth daily.    [provider]  vitamin E (VITAMIN E) 400 UNIT capsule Take 400 Units by mouth daily.    [provider]    Family History Family History  Problem Relation Age of Onset  . Cancer Mother        brain  . Hypertension Father   . Cancer Father        lymph nodes  . Diabetes Paternal Grandmother   . Hypertension Paternal Grandmother   . Hearing loss Neg Hx     Social History Social History   Tobacco Use  . Smoking status: Former Smoker    Types: Cigarettes    Last attempt to quit: 05/25/2012    Years since quitting: 5.3  . Smokeless tobacco: Never Used  Substance Use Topics  . Alcohol use: Yes    Comment: occasionally  . Drug use: No     Allergies   Latex   Review of Systems Review of Systems  Constitutional: Negative for fever.    Musculoskeletal: Positive for arthralgias and myalgias.  Skin: Negative for color change.  Neurological: Positive for numbness. Negative for weakness.     Physical Exam Updated Vital Signs BP (!) 146/102 (BP Location: Left Arm)   Pulse 77   Temp 98.6 F (37 C) (Oral)   Resp 16   SpO2 100%   Physical Exam  Constitutional: She appears well-developed and well-nourished. No distress.  HENT:  Head: Normocephalic and atraumatic.  Eyes: Right eye exhibits no discharge. Left eye exhibits no discharge. No scleral icterus.  Neck: Normal range of motion. Neck supple.  Mild tenderness to palpation of the right trapezius and sternocleidomastoid.  Pulmonary/Chest: No respiratory distress.  Musculoskeletal: Normal range of motion.  Limited range of motion of the right upper extremity secondary to pain.  Skin compartments are soft.  Brisk cap refill.  Radial pulses 2+ bilaterally.  Proprioception intact.  Sensation to sharp and dull intact.  Pain seems to follow with the C5-C6 dermatome.  She has numbness to the dorsum of the second and third digit.  Decreased grip strength secondary to pain.  Full range of motion of the right elbow and right wrist.  Neurological: She is alert.  Skin: Skin is warm and dry. Capillary refill takes less than 2 seconds. No pallor.  Psychiatric: Her behavior is normal. Judgment and thought content normal.  Nursing note and vitals reviewed.    ED Treatments / Results  Labs (all labs ordered are listed, but only abnormal results are displayed) Labs Reviewed  POC URINE PREG, ED  I-STAT CREATININE, ED    EKG None  Radiology Ct Cervical Spine Wo Contrast  Result Date: 09/13/2017 CLINICAL DATA:  42 y/o  F; shooting pain when raising the right arm. EXAM: CT CERVICAL SPINE WITHOUT CONTRAST TECHNIQUE: Multidetector CT imaging of the cervical spine was performed without intravenous contrast. Multiplanar CT image reconstructions were also generated. COMPARISON:  None.  FINDINGS: Alignment: Normal. Skull base and vertebrae: No acute fracture. No primary bone lesion or focal pathologic process. Soft tissues and spinal canal: No prevertebral fluid or swelling. No visible canal hematoma. Disc levels: C2-3: No significant disc displacement, foraminal stenosis, or canal stenosis. C3-4: Disc osteophyte complex eccentric to the left with left-sided uncovertebral hypertrophy. Mild left foraminal stenosis. No canal stenosis. C4-5: Disc osteophyte complex with mild bilateral uncovertebral hypertrophy. Mild  bilateral foraminal stenosis. No canal stenosis. C5-6: Moderate disc osteophyte complex and bilateral uncovertebral hypertrophy. Moderate bilateral foraminal and mild canal stenosis. C6-7: No significant disc displacement, foraminal stenosis, or canal stenosis. C7-T1: No significant disc displacement, foraminal stenosis, or canal stenosis. Upper chest: Negative. Other: None. IMPRESSION: 1. No acute fracture or malalignment. 2. Mild C3-4, mild C4-5, and moderate C5-6 discogenic degenerative changes. 3. Moderate bilateral C5-6 foraminal stenosis. Mild C3-4 and C4-5 foraminal stenosis. 4. Mild C5-6 canal stenosis.  No high-grade canal stenosis. Electronically Signed   By: Kristine Garbe M.D.   On: 09/13/2017 03:18    Procedures Procedures (including critical care time)  Medications Ordered in ED Medications  lidocaine (LIDODERM) 5 % 1 patch (1 patch Transdermal Patch Applied 09/13/17 0319)  oxyCODONE-acetaminophen (PERCOCET/ROXICET) 5-325 MG per tablet 1 tablet (1 tablet Oral Given 09/13/17 0319)  ketorolac (TORADOL) 15 MG/ML injection 15 mg (15 mg Intramuscular Given 09/13/17 0350)     Initial Impression / Assessment and Plan / ED Course  I have reviewed the triage vital signs and the nursing notes.  Pertinent labs & imaging results that were available during my care of the patient were reviewed by me and considered in my medical decision making (see chart for  details).     Patient presents to the ED today for evaluation of right shoulder and right arm pain.  Reports associated paresthesias.  Reduced range of motion secondary to pain.  Patient is neurovascularly intact.  However she does report some paresthesias to the right arm.  CT scan was obtained of neck that shows degenerative changes without any severe canal stenosis.  Pain improved with Toradol and oral pain medication.  Patient had significant increase in range of motion after pain medications.  I suspect a cervical radiculopathy.  Given the patient is neurovascularly intact without any significant stenosis on the CT scan patient can be followed in outpatient setting.  We will give a steroid pack, Voltaren gel and a small course of narcotic pain medication.  I did review patient's narcotic database that shows no prescriptions for narcotics in the past 12 months.  We will give neurosurgery follow-up.  Pt is hemodynamically stable, in NAD, & able to ambulate in the ED. Evaluation does not show pathology that would require ongoing emergent intervention or inpatient treatment. I explained the diagnosis to the patient. Pain has been managed & has no complaints prior to dc. Pt is comfortable with above plan and is stable for discharge at this time. All questions were answered prior to disposition. Strict return precautions for f/u to the ED were discussed. Encouraged follow up with PCP.  Pt dicussed with Dr. Randal Buba who is agreeable with the above plan.   Final Clinical Impressions(s) / ED Diagnoses   Final diagnoses:  Right arm pain    ED Discharge Orders        Ordered    methylPREDNISolone (MEDROL DOSEPAK) 4 MG TBPK tablet  Status:  Discontinued     09/13/17 0508    diclofenac sodium (VOLTAREN) 1 % GEL  4 times daily     09/13/17 0508    methylPREDNISolone (MEDROL DOSEPAK) 4 MG TBPK tablet     09/13/17 0510    HYDROcodone-acetaminophen (NORCO/VICODIN) 5-325 MG tablet  Every 4 hours PRN      09/13/17 0520       Doristine Devoid, PA-C 09/13/17 0523    Palumbo, April, MD 09/13/17 [id number]

## 2017-09-13 NOTE — Discharge Instructions (Signed)
Take the steroids as prescribed.  Use the Voltaren gel.  Do not use any additional NSAIDs.  Apply ice and heat to the area.  May use over-the-counter lidocaine patches.  Take the muscle relaxer that you have at home and take Tylenol.  Follow-up with neurosurgeon.  Return to ED with worsening symptoms.

## 2018-03-14 ENCOUNTER — Encounter: Payer: Self-pay | Admitting: Advanced Practice Midwife

## 2018-03-14 ENCOUNTER — Other Ambulatory Visit: Payer: Self-pay

## 2018-03-14 ENCOUNTER — Other Ambulatory Visit (HOSPITAL_COMMUNITY)
Admission: RE | Admit: 2018-03-14 | Discharge: 2018-03-14 | Disposition: A | Discharge status: Home / Self Care | Payer: BLUE CROSS/BLUE SHIELD | Source: Ambulatory Visit | Attending: Advanced Practice Midwife | Admitting: Advanced Practice Midwife

## 2018-03-14 ENCOUNTER — Ambulatory Visit (INDEPENDENT_AMBULATORY_CARE_PROVIDER_SITE_OTHER): Payer: BLUE CROSS/BLUE SHIELD | Admitting: Advanced Practice Midwife

## 2018-03-14 VITALS — BP 123/84 | HR 76 | Ht 62.5 in | Wt 165.2 lb

## 2018-03-14 DIAGNOSIS — Z01419 Encounter for gynecological examination (general) (routine) without abnormal findings: Secondary | ICD-10-CM | POA: Insufficient documentation

## 2018-03-14 DIAGNOSIS — Z23 Encounter for immunization: Secondary | ICD-10-CM

## 2018-03-14 MED ORDER — TETANUS-DIPHTH-ACELL PERTUSSIS 5-2.5-18.5 LF-MCG/0.5 IM SUSP
0.5000 mL | Freq: Once | INTRAMUSCULAR | Status: AC
  Administered 2018-03-14: 0.5 mL via INTRAMUSCULAR

## 2018-03-14 NOTE — Progress Notes (Signed)
GYNECOLOGY ANNUAL PREVENTATIVE CARE ENCOUNTER NOTE  Subjective:   Christina Weber is a 43 y.o. [phone] female here for a routine annual gynecologic exam.  Current complaints: None, wants TDaP today.   Denies abnormal vaginal bleeding, discharge, pelvic pain, problems with intercourse or other gynecologic concerns. Does not desire birth control, trying to conceive with current partner. Discussed possible progression into perimenopause - still having regular menses q24-28 days, her flow has gotten heavier and lasts longer since last baby.   Gynecologic History Patient's last menstrual period was 02/19/2018 (exact date). Contraception: none Last Pap: 2015. Results were: abnormal Last mammogram: 2019. Results were: normal  Obstetric History OB History  Gravida Para Term Preterm AB Living  7 4 2 2 3 4   SAB TAB Ectopic Multiple Live Births    1 2   4     # Outcome Date GA Lbr Weber/2nd Weight Sex Delivery Anes PTL Lv  7 Ectopic 09/2016          6 Term 01/26/13 [id number] / 00:14 3020 g F Vag-Spont EPI  LIV  5 TAB 11/20/05             Birth Comments: Baby had heart problems at early gestation  4 Preterm 02/16/97    F Vag-Spont   LIV  3 Term 10/08/95    F Vag-Spont   LIV  2 Preterm 01/09/94    M Vag-Spont   LIV  1 Ectopic 09/16/91            Past Medical History:  Diagnosis Date  . Abnormal Pap smear   . Breast abscess    right breast  . Complication of anesthesia    ? states caused umbilical area to harden  . HPV in female 2010  . Kidney stone    pyelo  . Psoriasis     Past Surgical History:  Procedure Laterality Date  . BREAST CYST ASPIRATION    . DILATION AND CURETTAGE OF UTERUS    . DILATION AND EVACUATION N/A 04/05/2014   Procedure: DILATATION AND EVACUATION;  Surgeon: JDarlyn Chamber MD;  [facility];  Service: Gynecology;  Laterality: N/A;  . LAPAROSCOPY  03/2001  . LEEP      Current Outpatient Medications on File Prior to Visit  Medication Sig Dispense Refill   . calcium carbonate (OSCAL) 1500 (600 Ca) MG TABS tablet Take 600 mg of elemental calcium by mouth 2 (two) times daily with a meal.    . ferrous sulfate 325 (65 FE) MG tablet Take 325 mg by mouth daily with breakfast.    . Misc Natural Products (TURMERIC CURCUMIN) CAPS Take 1 capsule by mouth daily.    . vitamin B-12 (CYANOCOBALAMIN) 1000 MCG tablet Take 1,000 mcg by mouth daily.    . vitamin E (VITAMIN E) 400 UNIT capsule Take 400 Units by mouth daily.    . diclofenac sodium (VOLTAREN) 1 % GEL Apply 2 g topically 4 (four) times daily. (Patient not taking: Reported on 03/14/2018) 100 g 0   No current facility-administered medications on file prior to visit.     Allergies  Allergen Reactions  . Latex Swelling, Rash and Other (See Comments)    Reaction: Localized swelling    Social History   Socioeconomic History  . Marital status: Divorced    Spouse name: Not on file  . Number of children: Not on file  . Years of education: Not on file  . Highest education level: Bachelor's degree (e.g., BA, AB,  BS)  Occupational History  . Not on file  Social Needs  . Financial resource strain: Not on file  . Food insecurity:    Worry: Not on file    Inability: Not on file  . Transportation needs:    Medical: Not on file    Non-medical: Not on file  Tobacco Use  . Smoking status: Former Smoker    Types: Cigarettes    Last attempt to quit: 05/25/2012    Years since quitting: 5.8  . Smokeless tobacco: Never Used  Substance and Sexual Activity  . Alcohol use: Yes    Comment: occasionally  . Drug use: No  . Sexual activity: Yes    Birth control/protection: None  Lifestyle  . Physical activity:    Days per week: Not on file    Minutes per session: Not on file  . Stress: Not on file  Relationships  . Social connections:    Talks on phone: Not on file    Gets together: Not on file    Attends religious service: Not on file    Active member of club or organization: Not on file     Attends meetings of clubs or organizations: Not on file    Relationship status: Not on file  . Intimate partner violence:    Fear of current or ex partner: No    Emotionally abused: No    Physically abused: No    Forced sexual activity: No  Other Topics Concern  . Not on file  Social History Narrative  . Not on file    Family History  Problem Relation Age of Onset  . Cancer Mother        brain  . Hypertension Father   . Cancer Father        lymph nodes  . Diabetes Paternal Grandmother   . Hypertension Paternal Grandmother   . Hearing loss Neg Hx     The following portions of the patient's history were reviewed and updated as appropriate: allergies, current medications, past family history, past medical history, past social history, past surgical history and problem list.  Review of Systems Pertinent items noted in HPI and remainder of comprehensive ROS otherwise negative.   Objective:  BP 123/84   Pulse 76   Ht 5' 2.5" (1.588 m)   Wt 74.9 kg   LMP 02/19/2018 (Exact Date)   BMI 29.73 kg/m  CONSTITUTIONAL: Well-developed, well-nourished female in no acute distress.  HENT:  Normocephalic, atraumatic, External right and left ear normal. Oropharynx is clear and moist EYES: Conjunctivae and EOM are normal. Pupils are equal, round, and reactive to light. No scleral icterus.  NECK: Normal range of motion, supple, no masses.  Normal thyroid.  SKIN: Skin is warm and dry. No rash noted. Not diaphoretic. No erythema. No pallor. NEUROLOGIC: Alert and oriented to person, place, and time. Normal reflexes, muscle tone coordination. No cranial nerve deficit noted. PSYCHIATRIC: Normal mood and affect. Normal behavior. Normal judgment and thought content. CARDIOVASCULAR: Normal heart rate noted, regular rhythm RESPIRATORY: Clear to auscultation bilaterally. Effort and breath sounds normal, no problems with respiration noted. BREASTS: Symmetric in size. No masses, skin changes, nipple  drainage, or lymphadenopathy. ABDOMEN: Soft, normal bowel sounds, no distention noted.  No tenderness, rebound or guarding.  PELVIC: Normal appearing external genitalia; normal appearing vaginal mucosa and cervix.  No abnormal discharge noted.  Pap smear obtained.  Normal uterine size, no other palpable masses, no uterine or adnexal tenderness. MUSCULOSKELETAL: Normal range of  motion. No tenderness.  No cyanosis, clubbing, or edema.  2+ distal pulses.   Assessment and Plan:  1. Well woman exam with routine gynecological exam - TDaP vaccine given - Cytology - PAP( [facility]) - MM DIGITAL SCREENING BILATERAL; Future  Will follow up results of pap smear and manage accordingly. Mammogram scheduled. Routine preventative health maintenance measures emphasized. Please refer to After Visit Summary for other counseling recommendations.

## 2018-03-14 NOTE — Addendum Note (Signed)
Addended by: Langston Reusing on: 03/14/2018 05:26 PM   Modules accepted: Orders, SmartSet

## 2018-03-15 ENCOUNTER — Other Ambulatory Visit: Payer: Self-pay | Admitting: Advanced Practice Midwife

## 2018-03-15 DIAGNOSIS — Z01419 Encounter for gynecological examination (general) (routine) without abnormal findings: Secondary | ICD-10-CM

## 2018-03-15 DIAGNOSIS — Z1231 Encounter for screening mammogram for malignant neoplasm of breast: Secondary | ICD-10-CM

## 2018-03-17 LAB — CYTOLOGY - PAP
Diagnosis: NEGATIVE
HPV 16/18/45 genotyping: POSITIVE — AB
HPV: DETECTED — AB

## 2018-03-21 ENCOUNTER — Telehealth: Payer: Self-pay | Admitting: *Deleted

## 2018-03-21 NOTE — Telephone Encounter (Signed)
-----   Message from Tresea Mall, CNM sent at 03/18/2018  2:06 PM EST ----- Patient with normal pap, but HPV 16 positive. Per [facility] needs colpo. Please call patient, and schedule colpo.

## 2018-03-21 NOTE — Telephone Encounter (Signed)
I called Christina Weber and notified her of her pap results and recommendations for colposcopy. She states she had one before in 2010 and we discussed she needs another and registrar will call or message her with appointment. She voices understanding.

## 2018-04-19 ENCOUNTER — Ambulatory Visit: Payer: BLUE CROSS/BLUE SHIELD | Admitting: Internal Medicine

## 2018-04-20 ENCOUNTER — Ambulatory Visit: Payer: BLUE CROSS/BLUE SHIELD | Admitting: Obstetrics and Gynecology

## 2018-04-20 ENCOUNTER — Other Ambulatory Visit: Payer: Self-pay

## 2018-04-20 ENCOUNTER — Encounter: Payer: Self-pay | Admitting: Obstetrics and Gynecology

## 2018-04-20 ENCOUNTER — Other Ambulatory Visit (HOSPITAL_COMMUNITY)
Admission: RE | Admit: 2018-04-20 | Discharge: 2018-04-20 | Disposition: A | Discharge status: Home / Self Care | Payer: BLUE CROSS/BLUE SHIELD | Source: Ambulatory Visit | Attending: Obstetrics and Gynecology | Admitting: Obstetrics and Gynecology

## 2018-04-20 VITALS — BP 118/78 | HR 90 | Wt 167.0 lb

## 2018-04-20 DIAGNOSIS — N879 Dysplasia of cervix uteri, unspecified: Secondary | ICD-10-CM | POA: Insufficient documentation

## 2018-04-20 NOTE — Procedures (Signed)
Colposcopy Procedure Note  Pre-operative Diagnosis: 03/2018 pap (+EC/TZ seen): NILM, HPV 16+  Post-operative Diagnosis: CIN 1   Procedure Details  Patient also denies any tobacco use or 2nd hand smoke exposure.   The risks (including infection, bleeding, pain) and benefits of the procedure were explained to the patient and written informed consent was obtained.  The patient was placed in the dorsal lithotomy position. A Graves was speculum inserted in the vagina, and the cervix was visualized.  AA staining done Lugol's with green filter. Biopsy not done and then single toothed tenaculum applied and ECC in all four quadrants done. No bleeding after procedure  Findings: Mild AWE changes seen  Adequate: No  Specimens: ECC  Condition: Stable  Complications: None  Plan: The patient was advised to call for any fever or for prolonged or severe pain or bleeding. She was advised to use OTC analgesics as needed for mild to moderate pain. She was advised to avoid vaginal intercourse for 48 hours or until the bleeding has completely stopped.   Durene Romans MD Attending Center for [employer] [facility] farm manager)

## 2018-04-21 LAB — POCT PREGNANCY, URINE: Preg Test, Ur: NEGATIVE

## 2018-04-22 ENCOUNTER — Encounter: Payer: Self-pay | Admitting: Obstetrics and Gynecology

## 2018-04-22 DIAGNOSIS — N879 Dysplasia of cervix uteri, unspecified: Secondary | ICD-10-CM | POA: Insufficient documentation

## 2018-05-06 ENCOUNTER — Encounter: Payer: Self-pay | Admitting: *Deleted

## 2018-07-25 ENCOUNTER — Ambulatory Visit: Payer: BLUE CROSS/BLUE SHIELD

## 2018-11-07 ENCOUNTER — Encounter: Payer: Self-pay | Admitting: Obstetrics and Gynecology

## 2018-11-07 ENCOUNTER — Other Ambulatory Visit (HOSPITAL_COMMUNITY)
Admission: RE | Admit: 2018-11-07 | Discharge: 2018-11-07 | Disposition: A | Discharge status: Home / Self Care | Payer: Medicaid Other | Source: Ambulatory Visit | Attending: Obstetrics and Gynecology | Admitting: Obstetrics and Gynecology

## 2018-11-07 ENCOUNTER — Ambulatory Visit: Payer: Medicaid Other | Admitting: Obstetrics and Gynecology

## 2018-11-07 ENCOUNTER — Other Ambulatory Visit: Payer: Self-pay

## 2018-11-07 VITALS — BP 121/82 | HR 89 | Wt 183.0 lb

## 2018-11-07 DIAGNOSIS — R1319 Other dysphagia: Secondary | ICD-10-CM | POA: Insufficient documentation

## 2018-11-07 DIAGNOSIS — N879 Dysplasia of cervix uteri, unspecified: Secondary | ICD-10-CM

## 2018-11-07 LAB — POCT PREGNANCY, URINE: Preg Test, Ur: NEGATIVE

## 2018-11-07 NOTE — Procedures (Signed)
Pap Smear and Endocervical Curettage Procedure Note  Pre-operative Diagnosis:  03/2018 pap (+EC/TZ): NILM/HPV16+ 04/2018 colpo (inadequate): - MINUTE ATYPICAL EPITHELIAL FRAGMENT CONSISTENT WITH SQUAMOUS INTRAEPITHELIAL LESION. - BENIGN ENDOCERVICAL MUCOSA.  Post-operative Diagnosis: CIN 1  Indications: minute sample on prior ECC for likely low grade disease.   Procedure Details  Urine pregnancy test was done and result was negative.  The risks (including infection, bleeding, pain) and benefits of the procedure were explained to the patient and Written informed consent was obtained.  The patient was placed in the dorsal lithotomy position. A Graves' speculum inserted in the vagina, and the cervix was visualized and a pap smear performed. The cervix was then prepped with povidone iodine, and a sharp tenaculum was applied to the anterior lip of the cervix for stabilization.  A four quadrant ECC was then performed. The samples were sent for pathologic examination.  Condition: Stable  Complications: None  Plan: The patient was advised to call for any fever or for prolonged or severe pain or bleeding. She was advised to use OTC analgesics as needed for mild to moderate pain. She was advised to avoid vaginal intercourse for 48 hours or until the bleeding has completely stopped.  Durene Romans MD Attending Center for [employer] farm manager)

## 2018-11-09 LAB — CYTOLOGY - PAP
Diagnosis: NEGATIVE
High risk HPV: NEGATIVE
Molecular Disclaimer: 56
Molecular Disclaimer: NORMAL

## 2018-11-09 LAB — SURGICAL PATHOLOGY

## 2018-12-09 ENCOUNTER — Encounter: Payer: Self-pay | Admitting: *Deleted

## 2019-01-11 ENCOUNTER — Encounter: Payer: Self-pay | Admitting: Family Medicine

## 2019-01-11 ENCOUNTER — Ambulatory Visit: Payer: Medicaid Other | Admitting: Obstetrics and Gynecology

## 2019-04-15 ENCOUNTER — Ambulatory Visit
Admission: EM | Admit: 2019-04-15 | Discharge: 2019-04-15 | Disposition: A | Discharge status: Home / Self Care | Payer: Medicaid Other | Attending: Physician Assistant | Admitting: Physician Assistant

## 2019-04-15 ENCOUNTER — Other Ambulatory Visit: Payer: Self-pay

## 2019-04-15 DIAGNOSIS — L0100 Impetigo, unspecified: Secondary | ICD-10-CM

## 2019-04-15 DIAGNOSIS — R21 Rash and other nonspecific skin eruption: Secondary | ICD-10-CM

## 2019-04-15 MED ORDER — MUPIROCIN 2 % EX OINT: 1.0000 "application " | TOPICAL_OINTMENT | Freq: Two times a day (BID) | CUTANEOUS | 0 refills | Status: DC

## 2019-04-15 MED ORDER — CEPHALEXIN 500 MG PO CAPS: 500.0000 mg | ORAL_CAPSULE | Freq: Four times a day (QID) | ORAL | 0 refills | Status: DC

## 2019-04-15 MED ORDER — PREDNISONE 50 MG PO TABS: 50.0000 mg | ORAL_TABLET | Freq: Every day | ORAL | 0 refills | Status: DC

## 2019-04-15 MED ORDER — CETIRIZINE HCL 10 MG PO TABS: 10.0000 mg | ORAL_TABLET | Freq: Every day | ORAL | 0 refills | Status: DC

## 2019-04-15 NOTE — ED Triage Notes (Signed)
Patient presents with multiple lesions over her body that she notes as ringworm. Started x 2 months ago after getting two kittens.  She reports trying antifungals methods at home, but not improving.

## 2019-04-15 NOTE — Discharge Instructions (Signed)
Start keflex and prednisone as directed. Zyrtec for itching. Bactroban on golden crusted area. Ice compress for any other itching. Monitor for worsening symptoms such as spreading redness, warmth, fever, follow up for reevaluation needed.

## 2019-04-15 NOTE — ED Provider Notes (Signed)
[facility]    CSN: [id number] Arrival date & time: 04/15/19  1440      History   Chief Complaint Chief Complaint  Patient presents with  . Tinea    HPI Christina Weber is a 44 y.o. female.   44 year old female comes in for 41-monthhistory of rash.  States this started after getting 2 kittens, and feels that the lesions may be caused by ringworms.  Rash first started to the neck/chest that is pruritic in sensation.  Rash is now spread throughout the trunk, upper and lower extremity.  States now with itching rash to the nipples, causing peeling of the skin and pain.  Denies spreading erythema, warmth.  Denies fever, chills, body aches.  Denies purulent drainage.  Has been using OTC antifungal creams without relief.     Past Medical History:  Diagnosis Date  . Abnormal Pap smear   . Breast abscess    right breast  . Complication of anesthesia    ? states caused umbilical area to harden  . HPV in female 2010  . Kidney stone    pyelo  . Psoriasis     Patient Active Problem List   Diagnosis Date Noted  . Cervical dysplasia 04/22/2018  . Breast abscess-right 02/28/2013    Past Surgical History:  Procedure Laterality Date  . BREAST CYST ASPIRATION    . DILATION AND CURETTAGE OF UTERUS    . DILATION AND EVACUATION N/A 04/05/2014   Procedure: DILATATION AND EVACUATION;  Surgeon: JDarlyn Chamber MD;  [facility];  Service: Gynecology;  Laterality: N/A;  . LAPAROSCOPY  03/2001  . LEEP      OB History    Gravida  7   Para  4   Term  2   Preterm  2   AB  3   Living  4     SAB      TAB  1   Ectopic  2   Multiple      Live Births  4            Home Medications    Prior to Admission medications   Medication Sig Start Date End Date Taking? Authorizing Provider  calcium carbonate (OSCAL) 1500 (600 Ca) MG TABS tablet Take 600 mg of elemental calcium by mouth 2 (two) times daily with a meal.    [provider]  cephALEXin  (KEFLEX) 500 MG capsule Take 1 capsule (500 mg total) by mouth 4 (four) times daily. 04/15/19   YTasia Catchings Tiffany Talarico V, PA-C  cetirizine (ZYRTEC ALLERGY) 10 MG tablet Take 1 tablet (10 mg total) by mouth daily. 04/15/19   YTasia Catchings Nuvia Hileman V, PA-C  diclofenac sodium (VOLTAREN) 1 % GEL Apply 2 g topically 4 (four) times daily. Patient not taking: Reported on 03/14/2018 09/13/17   LOcie CornfieldT, PA-C  ferrous sulfate 325 (65 FE) MG tablet Take 325 mg by mouth daily with breakfast.    [provider]  Misc Natural Products (TURMERIC CURCUMIN) CAPS Take 1 capsule by mouth daily.    [provider]  mupirocin ointment (BACTROBAN) 2 % Apply 1 application topically 2 (two) times daily. 04/15/19   YTasia Catchings Danielle Lento V, PA-C  predniSONE (DELTASONE) 50 MG tablet Take 1 tablet (50 mg total) by mouth daily with breakfast. 04/15/19   YTasia Catchings Daysha Ashmore V, PA-C  vitamin B-12 (CYANOCOBALAMIN) 1000 MCG tablet Take 1,000 mcg by mouth daily.    [provider]  vitamin E (VITAMIN E) 400  UNIT capsule Take 400 Units by mouth daily.    [provider]    Family History Family History  Problem Relation Age of Onset  . Cancer Mother        brain  . Hypertension Father   . Cancer Father        lymph nodes  . Diabetes Paternal Grandmother   . Hypertension Paternal Grandmother   . Hearing loss Neg Hx     Social History Social History   Tobacco Use  . Smoking status: Former Smoker    Types: Cigarettes    Quit date: 05/25/2012    Years since quitting: 6.8  . Smokeless tobacco: Never Used  Substance Use Topics  . Alcohol use: Yes    Comment: occasionally  . Drug use: No     Allergies   Latex   Review of Systems Review of Systems  Reason unable to perform ROS: See HPI as above.     Physical Exam Triage Vital Signs ED Triage Vitals  Enc Vitals Group     BP 04/15/19 1453 128/83     Pulse Rate 04/15/19 1453 81     Resp 04/15/19 1453 16     Temp 04/15/19 1453 98.9 F (37.2 C)     Temp Source 04/15/19 1453  Oral     SpO2 04/15/19 1453 97 %     Weight --      Height --      Head Circumference --      Peak Flow --      Pain Score 04/15/19 1454 0     Pain Loc --      Pain Edu? --      Excl. in [place]? --    No data found.  Updated Vital Signs BP 128/83 (BP Location: Left Arm)   Pulse 81   Temp 98.9 F (37.2 C) (Oral)   Resp 16   LMP 02/13/2019   SpO2 97%   Visual Acuity Right Eye Distance:   Left Eye Distance:   Bilateral Distance:    Right Eye Near:   Left Eye Near:    Bilateral Near:     Physical Exam Constitutional:      General: She is not in acute distress.    Appearance: Normal appearance. She is well-developed. She is not toxic-appearing or diaphoretic.  HENT:     Head: Normocephalic and atraumatic.  Eyes:     Conjunctiva/sclera: Conjunctivae normal.     Pupils: Pupils are equal, round, and reactive to light.  Pulmonary:     Effort: Pulmonary effort is normal. No respiratory distress.     Comments: Speaking in full sentences without difficulty Musculoskeletal:     Cervical back: Normal range of motion and neck supple.  Skin:    General: Skin is warm and dry.     Comments: See picture below.  Maculopapular rashes to the body with surrounding skin peeling and dryness.  No central clearing.  Few lesions with open skin, golden crusting.  Bilateral nipples with skin peeling and golden crusting.  No surrounding erythema or warmth.  Minimal tenderness to palpation.  No abscess noted.  Neurological:     Mental Status: She is alert and oriented to person, place, and time.            UC Treatments / Results  Labs (all labs ordered are listed, but only abnormal results are displayed) Labs Reviewed - No data to display  EKG   Radiology No results  found.  Procedures Procedures (including critical care time)  Medications Ordered in UC Medications - No data to display  Initial Impression / Assessment and Plan / UC Course  I have reviewed the triage vital  signs and the nursing notes.  Pertinent labs & imaging results that were available during my care of the patient were reviewed by me and considered in my medical decision making (see chart for details).    History and exam are consistent with contact dermatitis with superimposing impetigo.  Low suspicion for tinea given widespread without central clearing.  Will start patient on Keflex and prednisone at this time.  Bactroban ointment as directed.  Return precautions given.  Patient expresses understanding and agrees to plan.  Final Clinical Impressions(s) / UC Diagnoses   Final diagnoses:  Rash  Impetigo   ED Prescriptions    Medication Sig Dispense Auth. Provider   predniSONE (DELTASONE) 50 MG tablet Take 1 tablet (50 mg total) by mouth daily with breakfast. 5 tablet Aylani Spurlock V, PA-C   cephALEXin (KEFLEX) 500 MG capsule Take 1 capsule (500 mg total) by mouth 4 (four) times daily. 28 capsule Baylen Dea V, PA-C   mupirocin ointment (BACTROBAN) 2 % Apply 1 application topically 2 (two) times daily. 22 g Wyoma Genson V, PA-C   cetirizine (ZYRTEC ALLERGY) 10 MG tablet Take 1 tablet (10 mg total) by mouth daily. 15 tablet Ok Edwards, PA-C     PDMP not reviewed this encounter.   Ok Edwards, PA-C 04/15/19 1524

## 2019-11-01 ENCOUNTER — Other Ambulatory Visit: Payer: Self-pay | Admitting: Family Medicine

## 2019-11-07 ENCOUNTER — Other Ambulatory Visit: Payer: Self-pay | Admitting: Family Medicine

## 2019-11-07 DIAGNOSIS — Z1231 Encounter for screening mammogram for malignant neoplasm of breast: Secondary | ICD-10-CM

## 2019-11-22 ENCOUNTER — Other Ambulatory Visit: Payer: Self-pay

## 2019-11-22 ENCOUNTER — Ambulatory Visit
Admission: RE | Admit: 2019-11-22 | Discharge: 2019-11-22 | Disposition: A | Discharge status: Home / Self Care | Payer: Medicaid Other | Source: Ambulatory Visit

## 2019-11-22 DIAGNOSIS — Z1231 Encounter for screening mammogram for malignant neoplasm of breast: Secondary | ICD-10-CM

## 2019-12-08 ENCOUNTER — Ambulatory Visit (INDEPENDENT_AMBULATORY_CARE_PROVIDER_SITE_OTHER): Payer: Medicaid Other | Admitting: Obstetrics and Gynecology

## 2019-12-08 ENCOUNTER — Encounter: Payer: Self-pay | Admitting: Obstetrics and Gynecology

## 2019-12-08 ENCOUNTER — Other Ambulatory Visit (HOSPITAL_COMMUNITY)
Admission: RE | Admit: 2019-12-08 | Discharge: 2019-12-08 | Disposition: A | Discharge status: Home / Self Care | Payer: Medicaid Other | Source: Ambulatory Visit | Attending: Obstetrics and Gynecology | Admitting: Obstetrics and Gynecology

## 2019-12-08 ENCOUNTER — Other Ambulatory Visit: Payer: Self-pay

## 2019-12-08 VITALS — BP 128/97 | HR 73 | Wt 163.8 lb

## 2019-12-08 DIAGNOSIS — R030 Elevated blood-pressure reading, without diagnosis of hypertension: Secondary | ICD-10-CM | POA: Diagnosis not present

## 2019-12-08 DIAGNOSIS — Z01411 Encounter for gynecological examination (general) (routine) with abnormal findings: Secondary | ICD-10-CM | POA: Diagnosis not present

## 2019-12-08 DIAGNOSIS — R87810 Cervical high risk human papillomavirus (HPV) DNA test positive: Secondary | ICD-10-CM

## 2019-12-08 DIAGNOSIS — Z01419 Encounter for gynecological examination (general) (routine) without abnormal findings: Secondary | ICD-10-CM | POA: Insufficient documentation

## 2019-12-08 DIAGNOSIS — R87610 Atypical squamous cells of undetermined significance on cytologic smear of cervix (ASC-US): Secondary | ICD-10-CM | POA: Diagnosis not present

## 2019-12-08 DIAGNOSIS — I10 Essential (primary) hypertension: Secondary | ICD-10-CM | POA: Insufficient documentation

## 2019-12-08 DIAGNOSIS — N879 Dysplasia of cervix uteri, unspecified: Secondary | ICD-10-CM

## 2019-12-08 NOTE — Progress Notes (Signed)
Obstetrics and Gynecology Annual Patient Evaluation  Appointment Date: 12/08/2019  OBGYN Clinic: [facility]  Primary Care Provider: Wendie Agreste  Chief Complaint:  Chief Complaint  Patient presents with  . Gynecologic Exam    History of Present Illness: Christina Weber is a 44 y.o. Caucasian [id number] (Patient's last menstrual period was 08/05/2019 (exact date).), seen for the above chief complaint. Her past medical history is significant for transient HTN, h/o abnormal paps, h/o LEEP  Recent partner infidelity; pt would like to get tested. LMP in June 2021 and hot flashes since about a year ago. She has no other climateric s/s.   Review of Systems: Pertinent items noted in HPI and remainder of comprehensive ROS otherwise negative.    Past Medical History:  Past Medical History:  Diagnosis Date  . Abnormal Pap smear   . Breast abscess    right breast  . Complication of anesthesia    ? states caused umbilical area to harden  . HPV in female 2010  . Kidney stone    pyelo  . Psoriasis     Past Surgical History:  Past Surgical History:  Procedure Laterality Date  . BREAST CYST ASPIRATION    . DILATION AND CURETTAGE OF UTERUS    . DILATION AND EVACUATION N/A 04/05/2014   Procedure: DILATATION AND EVACUATION;  Surgeon: Darlyn Chamber, MD;  Location: [facility];  Service: Gynecology;  Laterality: N/A;  . LAPAROSCOPY  03/2001  . LEEP      Past Obstetrical History:  OB History  Gravida Para Term Preterm AB Living  7 4 2 2 3 4   SAB TAB Ectopic Multiple Live Births    1 2   4     # Outcome Date GA Lbr Len/2nd Weight Sex Delivery Anes PTL Lv  7 Ectopic 09/2016          6 Term 01/26/13 [id number] / 00:14 6 lb 10.5 oz (3.02 kg) F Vag-Spont EPI  LIV  5 TAB 11/20/05             Birth Comments: Baby had heart problems at early gestation  4 Preterm 02/16/97    F Vag-Spont   LIV  3 Term 10/08/95    F Vag-Spont   LIV  2 Preterm 01/09/94     M Vag-Spont   LIV  1 Ectopic 09/16/91            Past Gynecological History: As per HPI. History of Pap Smear(s): Yes.    10/2018: neg pap and HPV testing. +EC/TZ seen 04/2018: colpo inadequate: minute fragments c/w SIL. Benign ECC 03/2018: NILM/HPV+. +EC/TZ seen   She is currently using no method for contraception.  History of HRT use: No.  Social History:  Social History   Socioeconomic History  . Marital status: Single    Spouse name: Not on file  . Number of children: Not on file  . Years of education: Not on file  . Highest education level: Bachelor's degree (e.g., BA, AB, BS)  Occupational History  . Not on file  Tobacco Use  . Smoking status: Former Smoker    Types: Cigarettes    Quit date: 05/25/2012    Years since quitting: 7.5  . Smokeless tobacco: Never Used  Vaping Use  . Vaping Use: Never used  Substance and Sexual Activity  . Alcohol use: Yes    Comment: occasionally  . Drug use: No  . Sexual activity: Yes    Birth  control/protection: None  Other Topics Concern  . Not on file  Social History Narrative  . Not on file   Social Determinants of Health   Financial Resource Strain:   . Difficulty of Paying Living Expenses: Not on file  Food Insecurity:   . Worried About Charity fundraiser in the Last Year: Not on file  . Ran Out of Food in the Last Year: Not on file  Transportation Needs:   . Lack of Transportation (Medical): Not on file  . Lack of Transportation (Non-Medical): Not on file  Physical Activity:   . Days of Exercise per Week: Not on file  . Minutes of Exercise per Session: Not on file  Stress:   . Feeling of Stress : Not on file  Social Connections:   . Frequency of Communication with Friends and Family: Not on file  . Frequency of Social Gatherings with Friends and Family: Not on file  . Attends Religious Services: Not on file  . Active Member of Clubs or Organizations: Not on file  . Attends Archivist Meetings: Not on  file  . Marital Status: Not on file  Intimate Partner Violence:   . Fear of Current or Ex-Partner: Not on file  . Emotionally Abused: Not on file  . Physically Abused: Not on file  . Sexually Abused: Not on file    Family History:  Family History  Problem Relation Age of Onset  . Cancer Mother        brain  . Hypertension Father   . Cancer Father        lymph nodes  . Diabetes Paternal Grandmother   . Hypertension Paternal Grandmother   . Hearing loss Neg Hx    Health Maintenance:  Mammogram(s): Yes.   Date: 11/2019 negative  Medications Christina Weber had no medications administered during this visit. No current outpatient medications on file.   No current facility-administered medications for this visit.    Allergies Latex   Physical Exam:  BP (!) 128/97   Pulse 73   Wt 163 lb 12.8 oz (74.3 kg)   LMP 08/05/2019 (Exact Date)   Breastfeeding No   BMI 29.48 kg/m  Body mass index is 29.48 kg/m.  General appearance: Well nourished, well developed female in no acute distress.  Neck:  Supple, normal appearance, and no thyromegaly  Cardiovascular: normal s1 and s2.  No murmurs, rubs or gallops. Respiratory:  Clear to auscultation bilateral. Normal respiratory effort Abdomen: positive bowel sounds and no masses, hernias; diffusely non tender to palpation, non distended Breasts: breasts appear normal, no suspicious masses, no skin or nipple changes or axillary nodes, and negative palpation. Neuro/Psych:  Normal mood and affect.  Skin:  Warm and dry.  Lymphatic:  No inguinal lymphadenopathy.   Pelvic exam: is not limited by body habitus EGBUS: within normal limits Vagina: within normal limits and with no blood or discharge in the vault Cervix: normal appearing cervix without tenderness, discharge or lesions. Uterus:  nonenlarged and non tender Adnexa:  normal adnexa and no mass, fullness, tenderness Rectovaginal: deferred  Laboratory: none  Radiology:  none  Assessment: pt doing well  Plan: 1. Well woman exam with routine gynecological exam Routine care. Declines BC. STD screening today. Repeat pap and hpv testing today.  - Hepatitis C Antibody - Hepatitis B Surface AntiGEN - RPR - HIV Antibody (routine testing w rflx) - Cytology - PAP( [facility])  2. Transient hypertension Recommend PCP follow up.  Orders Placed This Encounter  Procedures  . Hepatitis C Antibody  . Hepatitis B Surface AntiGEN  . RPR  . HIV Antibody (routine testing w rflx)    RTC 1 year and PRN  Durene Romans MD Attending Center for [facility])

## 2019-12-09 LAB — RPR: RPR Ser Ql: NONREACTIVE

## 2019-12-09 LAB — HIV ANTIBODY (ROUTINE TESTING W REFLEX): HIV Screen 4th Generation wRfx: NONREACTIVE

## 2019-12-09 LAB — HEPATITIS B SURFACE ANTIGEN: Hepatitis B Surface Ag: NEGATIVE

## 2019-12-09 LAB — HEPATITIS C ANTIBODY: Hep C Virus Ab: <0.1 s/co ratio (ref 0.0–0.9)

## 2019-12-14 LAB — CYTOLOGY - PAP
Chlamydia: NEGATIVE
Comment: NEGATIVE
Comment: NEGATIVE
Comment: NEGATIVE
Comment: NORMAL
Diagnosis: UNDETERMINED — AB
HPV 16: POSITIVE — AB
HPV 18 / 45: NEGATIVE
High risk HPV: POSITIVE — AB
Neisseria Gonorrhea: NEGATIVE

## 2020-01-15 ENCOUNTER — Other Ambulatory Visit: Payer: Self-pay

## 2020-01-15 ENCOUNTER — Ambulatory Visit (INDEPENDENT_AMBULATORY_CARE_PROVIDER_SITE_OTHER): Payer: Self-pay | Admitting: Obstetrics and Gynecology

## 2020-01-15 ENCOUNTER — Other Ambulatory Visit (HOSPITAL_COMMUNITY)
Admission: RE | Admit: 2020-01-15 | Discharge: 2020-01-15 | Disposition: A | Discharge status: Home / Self Care | Payer: Medicaid Other | Source: Ambulatory Visit | Attending: Obstetrics and Gynecology | Admitting: Obstetrics and Gynecology

## 2020-01-15 ENCOUNTER — Encounter: Payer: Self-pay | Admitting: Obstetrics and Gynecology

## 2020-01-15 VITALS — BP 120/106 | HR 84 | Wt 158.9 lb

## 2020-01-15 DIAGNOSIS — N870 Mild cervical dysplasia: Secondary | ICD-10-CM | POA: Insufficient documentation

## 2020-01-15 DIAGNOSIS — Z3202 Encounter for pregnancy test, result negative: Secondary | ICD-10-CM

## 2020-01-15 DIAGNOSIS — N879 Dysplasia of cervix uteri, unspecified: Secondary | ICD-10-CM

## 2020-01-15 LAB — POCT PREGNANCY, URINE: Preg Test, Ur: NEGATIVE

## 2020-01-15 NOTE — Procedures (Signed)
Colposcopy Procedure Note  Pre-operative Diagnosis:  11/2019 pap (+EC/TZ): ascus, hpv 16+, neg hpv 18/45 10/2018 pap (+ec/tz): neg pap and hpv 10/2018 ecc: scant LUS fragments (benign) 04/2018 colpo: inadequate, +p16 minute fragments of atypical squam cells. cin 1 on colpo viewing 03/2018 pap (+EC/TZ seen): NILM, HPV 16+ 2010 LEEP: cin 2, +endocervical and deep soft tissue margins with EC gland extension 2010 colpo: neg ecc with cin 2-3 on biopsy  Post-operative Diagnosis: cin 1   Procedure Details  The risks (including infection, bleeding, pain) and benefits of the procedure were explained to the patient and written informed consent was obtained.  The patient was placed in the dorsal lithotomy position. A Graves was speculum inserted in the vagina, and the cervix was visualized.  AA staining done Lugol's with green filter.  Biopsy from 12 o'clock and then single toothed tenaculum applied and ECC in all four quadrants done. No bleeding after procedure  Findings: slight AWE changes at 12 o'clock  Adequate: No  Specimens: 12 o'clock cervical biopsy. Endocervical curettage  Condition: Stable  Complications: None  Plan: The patient was advised to call for any fever or for prolonged or severe pain or bleeding. She was advised to use OTC analgesics as needed for mild to moderate pain. She was advised to avoid vaginal intercourse for 48 hours or until the bleeding has completely stopped.   Durene Romans MD Attending Center for [facility] [employer])

## 2020-01-16 ENCOUNTER — Other Ambulatory Visit: Payer: Self-pay

## 2020-01-16 ENCOUNTER — Ambulatory Visit
Admission: EM | Admit: 2020-01-16 | Discharge: 2020-01-16 | Disposition: A | Discharge status: Home / Self Care | Payer: Medicaid Other | Attending: Family Medicine | Admitting: Family Medicine

## 2020-01-16 DIAGNOSIS — R002 Palpitations: Secondary | ICD-10-CM

## 2020-01-16 DIAGNOSIS — I10 Essential (primary) hypertension: Secondary | ICD-10-CM

## 2020-01-16 MED ORDER — METOPROLOL SUCCINATE ER 50 MG PO TB24: 25.0000 mg | ORAL_TABLET | Freq: Every day | ORAL | 2 refills | Status: DC

## 2020-01-16 NOTE — ED Triage Notes (Signed)
Pt presents to Urgent Care with c/o occasional heart palpitations/fluttering since last May, after having an episode which she considered to be a "mini stroke" (although never evaluated or diagnosed). Also reports BP has been elevated.

## 2020-01-16 NOTE — ED Provider Notes (Signed)
[facility]    CSN: [id number] Arrival date & time: 01/16/20  1151      History   Chief Complaint Chief Complaint  Patient presents with  . Palpitations  . Hypertension    HPI Christina Weber is a 44 y.o. female.   Established [facility] patient  Pt presents to Urgent Care with c/o occasional heart palpitations/fluttering since last May, after having an episode which she considered to be a "mini stroke" (although never evaluated or diagnosed). Also reports BP has been elevated.  Patient has had progressively more frequent palpitations over 6 months.  Also, she has had several elevated blood pressure readings in doctors offices, most recently her gyn, and they sent her heere.      Past Medical History:  Diagnosis Date  . Abnormal Pap smear   . Breast abscess    right breast  . Complication of anesthesia    ? states caused umbilical area to harden  . HPV in female 2010  . Kidney stone    pyelo  . Psoriasis     Patient Active Problem List   Diagnosis Date Noted  . Hypertension 12/08/2019  . Cervical dysplasia 04/22/2018  . Breast abscess-right 02/28/2013    Past Surgical History:  Procedure Laterality Date  . BREAST CYST ASPIRATION    . DILATION AND CURETTAGE OF UTERUS    . DILATION AND EVACUATION N/A 04/05/2014   Procedure: DILATATION AND EVACUATION;  Surgeon: Darlyn Chamber, MD;  Location: [facility];  Service: Gynecology;  Laterality: N/A;  . LAPAROSCOPY  03/2001  . LEEP      OB History    Gravida  7   Para  4   Term  2   Preterm  2   AB  3   Living  4     SAB      TAB  1   Ectopic  2   Multiple      Live Births  4            Home Medications    Prior to Admission medications   Medication Sig Start Date End Date Taking? Authorizing Provider  AZO-CRANBERRY PO Take by mouth daily.    [provider]  metoprolol succinate (TOPROL-XL) 50 MG 24 hr tablet Take 0.5 tablets (25 mg total) by mouth daily. 01/16/20    Robyn Haber, MD  Multiple Vitamins-Minerals (ESTROVEN MENOPAUSE SUPPLEMENT PO) Take by mouth daily.    [provider]    Family History Family History  Problem Relation Age of Onset  . Cancer Mother        brain  . Hypertension Father   . Cancer Father        lymph nodes  . Diabetes Paternal Grandmother   . Hypertension Paternal Grandmother   . Hearing loss Neg Hx     Social History Social History   Tobacco Use  . Smoking status: Former Smoker    Types: Cigarettes    Quit date: 05/25/2012    Years since quitting: 7.6  . Smokeless tobacco: Never Used  Vaping Use  . Vaping Use: Never used  Substance Use Topics  . Alcohol use: Yes    Comment: occasionally on weekends  . Drug use: No     Allergies   Latex   Review of Systems Review of Systems  Respiratory: Positive for shortness of breath.   Cardiovascular: Positive for palpitations.  All other systems reviewed and are negative.    Physical  Exam Triage Vital Signs ED Triage Vitals  Enc Vitals Group     BP 01/16/20 1228 (!) 162/85     Pulse Rate 01/16/20 1228 73     Resp 01/16/20 1228 20     Temp 01/16/20 1228 (!) 97.5 F (36.4 C)     Temp Source 01/16/20 1228 Oral     SpO2 01/16/20 1228 99 %     Weight 01/16/20 1225 158 lb (71.7 kg)     Height 01/16/20 1225 5' 2.5" (1.588 m)     Head Circumference --      Peak Flow --      Pain Score 01/16/20 1225 0     Pain Loc --      Pain Edu? --      Excl. in [place]? --    No data found.  Updated Vital Signs BP (!) 162/85 (BP Location: Left Arm)   Pulse 73   Temp (!) 97.5 F (36.4 C) (Oral)   Resp 20   Ht 5' 2.5" (1.588 m)   Wt 71.7 kg   LMP 08/05/2019 (Exact Date)   SpO2 99%   BMI 28.44 kg/m   Visual Acuity  Physical Exam Vitals and nursing note reviewed.  Constitutional:      Appearance: Normal appearance. She is normal weight.  Eyes:     Conjunctiva/sclera: Conjunctivae normal.  Cardiovascular:     Rate and Rhythm: Normal rate and  regular rhythm.     Heart sounds: Normal heart sounds.  Pulmonary:     Effort: Pulmonary effort is normal.     Breath sounds: Normal breath sounds.  Abdominal:     Palpations: Abdomen is soft.  Musculoskeletal:        General: Normal range of motion.     Cervical back: Normal range of motion and neck supple.  Skin:    General: Skin is warm and dry.  Neurological:     General: No focal deficit present.     Mental Status: She is alert.  Psychiatric:        Mood and Affect: Mood normal.      UC Treatments / Results  Labs (all labs ordered are listed, but only abnormal results are displayed) Labs Reviewed - No data to display  EKG Normal 12 lead ECG  Radiology No results found.  Procedures Procedures (including critical care time)  Medications Ordered in UC Medications - No data to display  Initial Impression / Assessment and Plan / UC Course  I have reviewed the triage vital signs and the nursing notes.  Pertinent labs & imaging results that were available during my care of the patient were reviewed by me and considered in my medical decision making (see chart for details).    Final Clinical Impressions(s) / UC Diagnoses   Final diagnoses:  Palpitations  Essential hypertension     Discharge Instructions     Get your blood pressure rechecked in one month    ED Prescriptions    Medication Sig Dispense Auth. Provider   metoprolol succinate (TOPROL-XL) 50 MG 24 hr tablet Take 0.5 tablets (25 mg total) by mouth daily. 60 tablet Robyn Haber, MD     I have reviewed the PDMP during this encounter.   Robyn Haber, MD 01/16/20 1251

## 2020-01-16 NOTE — Discharge Instructions (Addendum)
Get your blood pressure rechecked in one month

## 2020-01-17 ENCOUNTER — Encounter: Payer: Self-pay | Admitting: Obstetrics and Gynecology

## 2020-01-17 HISTORY — PX: COLPOSCOPY: PRO47

## 2020-01-17 LAB — SURGICAL PATHOLOGY

## 2020-01-20 ENCOUNTER — Encounter: Payer: Self-pay | Admitting: Emergency Medicine

## 2020-01-20 ENCOUNTER — Ambulatory Visit (HOSPITAL_COMMUNITY)
Admission: EM | Admit: 2020-01-20 | Discharge: 2020-01-20 | Disposition: A | Discharge status: Home / Self Care | Payer: Medicaid Other | Attending: Physician Assistant | Admitting: Physician Assistant

## 2020-01-20 ENCOUNTER — Other Ambulatory Visit: Payer: Self-pay

## 2020-01-20 ENCOUNTER — Ambulatory Visit
Admission: EM | Admit: 2020-01-20 | Discharge: 2020-01-20 | Disposition: A | Discharge status: Home / Self Care | Payer: Medicaid Other | Attending: Emergency Medicine | Admitting: Emergency Medicine

## 2020-01-20 ENCOUNTER — Ambulatory Visit (INDEPENDENT_AMBULATORY_CARE_PROVIDER_SITE_OTHER): Payer: Self-pay

## 2020-01-20 DIAGNOSIS — M25532 Pain in left wrist: Secondary | ICD-10-CM

## 2020-01-20 DIAGNOSIS — S52602A Unspecified fracture of lower end of left ulna, initial encounter for closed fracture: Secondary | ICD-10-CM

## 2020-01-20 DIAGNOSIS — W19XXXA Unspecified fall, initial encounter: Secondary | ICD-10-CM

## 2020-01-20 MED ORDER — HYDROCODONE-ACETAMINOPHEN 5-325 MG PO TABS: 1.0000 | ORAL_TABLET | Freq: Four times a day (QID) | ORAL | 0 refills | Status: DC | PRN

## 2020-01-20 MED ORDER — IBUPROFEN 800 MG PO TABS: 800.0000 mg | ORAL_TABLET | Freq: Three times a day (TID) | ORAL | 0 refills | Status: DC

## 2020-01-20 NOTE — Discharge Instructions (Signed)
Please go to [facility] urgent care for splinting Use anti-inflammatories for pain/swelling. You may take up to 800 mg Ibuprofen every 8 hours with food. You may supplement Ibuprofen with Tylenol [id number] mg every 8 hours.  Hydrocodone for severe pain Please follow-up with Ortho care Monday morning-contact below

## 2020-01-20 NOTE — ED Triage Notes (Signed)
Pt here with left wrist pain after trip and fall last night; bruising and swelling noted to left wrist/forearm

## 2020-01-20 NOTE — ED Provider Notes (Signed)
[facility]    CSN: [id number] Arrival date & time: 01/20/20  1306      History   Chief Complaint Chief Complaint  Patient presents with  . Wrist Pain    HPI Christina Weber is a 44 y.o. female history of hypertension presenting today for evaluation of left wrist injury.  Injury happened last night.  Tripped and fell and her and her boyfriend fell on top of her left wrist.  Since she has had pain swelling and bruising to forearm.  She denies any numbness or tingling.  HPI  Past Medical History:  Diagnosis Date  . Abnormal Pap smear   . Breast abscess    right breast  . Complication of anesthesia    ? states caused umbilical area to harden  . HPV in female 2010  . Kidney stone    pyelo  . Psoriasis     Patient Active Problem List   Diagnosis Date Noted  . Hypertension 12/08/2019  . Cervical dysplasia 04/22/2018  . Breast abscess-right 02/28/2013    Past Surgical History:  Procedure Laterality Date  . BREAST CYST ASPIRATION    . COLPOSCOPY  01/17/2020      . DILATION AND CURETTAGE OF UTERUS    . DILATION AND EVACUATION N/A 04/05/2014   Procedure: DILATATION AND EVACUATION;  Surgeon: Darlyn Chamber, MD;  Location: [facility];  Service: Gynecology;  Laterality: N/A;  . LAPAROSCOPY  03/2001  . LEEP  2010    OB History    Gravida  7   Para  4   Term  2   Preterm  2   AB  3   Living  4     SAB      IAB  1   Ectopic  2   Multiple      Live Births  4            Home Medications    Prior to Admission medications   Medication Sig Start Date End Date Taking? Authorizing Provider  AZO-CRANBERRY PO Take by mouth daily.    [provider]  HYDROcodone-acetaminophen (NORCO/VICODIN) 5-325 MG tablet Take 1-2 tablets by mouth every 6 (six) hours as needed. 01/20/20   Wieters, Hallie C, PA-C  ibuprofen (ADVIL) 800 MG tablet Take 1 tablet (800 mg total) by mouth 3 (three) times daily. 01/20/20   Wieters, Hallie C, PA-C  metoprolol  succinate (TOPROL-XL) 50 MG 24 hr tablet Take 0.5 tablets (25 mg total) by mouth daily. 01/16/20   Robyn Haber, MD  Multiple Vitamins-Minerals (ESTROVEN MENOPAUSE SUPPLEMENT PO) Take by mouth daily.    [provider]    Family History Family History  Problem Relation Age of Onset  . Cancer Mother        brain  . Hypertension Father   . Cancer Father        lymph nodes  . Diabetes Paternal Grandmother   . Hypertension Paternal Grandmother   . Hearing loss Neg Hx     Social History Social History   Tobacco Use  . Smoking status: Former Smoker    Types: Cigarettes    Quit date: 05/25/2012    Years since quitting: 7.6  . Smokeless tobacco: Never Used  Vaping Use  . Vaping Use: Never used  Substance Use Topics  . Alcohol use: Yes    Comment: occasionally on weekends  . Drug use: No     Allergies   Latex   Review of  Systems Review of Systems  Constitutional: Negative for fatigue and fever.  Eyes: Negative for visual disturbance.  Respiratory: Negative for shortness of breath.   Cardiovascular: Negative for chest pain.  Gastrointestinal: Negative for abdominal pain, nausea and vomiting.  Musculoskeletal: Positive for arthralgias and joint swelling.  Skin: Negative for color change, rash and wound.  Neurological: Negative for dizziness, weakness, light-headedness and headaches.     Physical Exam Triage Vital Signs ED Triage Vitals  Enc Vitals Group     BP      Pulse      Resp      Temp      Temp src      SpO2      Weight      Height      Head Circumference      Peak Flow      Pain Score      Pain Loc      Pain Edu?      Excl. in [place]?    No data found.  Updated Vital Signs BP 117/79 (BP Location: Left Arm)   Pulse 87   Temp 98.2 F (36.8 C) (Oral)   Resp 18   SpO2 97%   Visual Acuity Right Eye Distance:   Left Eye Distance:   Bilateral Distance:    Right Eye Near:   Left Eye Near:    Bilateral Near:     Physical Exam Vitals  and nursing note reviewed.  Constitutional:      Appearance: She is well-developed and well-nourished.     Comments: No acute distress  HENT:     Head: Normocephalic and atraumatic.     Nose: Nose normal.  Eyes:     Conjunctiva/sclera: Conjunctivae normal.  Cardiovascular:     Rate and Rhythm: Normal rate.  Pulmonary:     Effort: Pulmonary effort is normal. No respiratory distress.  Abdominal:     General: There is no distension.  Musculoskeletal:        General: Normal range of motion.     Cervical back: Neck supple.     Comments: Left forearm/wrist: Swelling noted about distal forearm, with superficial abrasions, tender to palpation along distal ulna, nontender throughout carpals and metacarpals, full active range of motion of all 5 fingers, radial pulse 2+, sensation intact distally throughout all 5 fingers  Skin:    General: Skin is warm and dry.  Neurological:     Mental Status: She is alert and oriented to person, place, and time.  Psychiatric:        Mood and Affect: Mood and affect normal.      UC Treatments / Results  Labs (all labs ordered are listed, but only abnormal results are displayed) Labs Reviewed - No data to display  EKG   Radiology DG Wrist Complete Left  Result Date: 01/20/2020 CLINICAL DATA:  Left wrist pain post fall last night. EXAM: LEFT WRIST - COMPLETE 3+ VIEW COMPARISON:  None. FINDINGS: There is a displaced transverse fracture involving the distal ulna with approximately 6 mm of displacement. The distal radius and carpal bones appear intact. There is no evidence of arthropathy or other focal bone abnormality. Regional soft tissue swelling. IMPRESSION: Displaced transverse fracture of the distal ulna. Electronically Signed   By: Audie Pinto M.D.   On: 01/20/2020 14:00    Procedures Procedures (including critical care time)  Medications Ordered in UC Medications - No data to display  Initial Impression / Assessment and Plan /  UC  Course  I have reviewed the triage vital signs and the nursing notes.  Pertinent labs & imaging results that were available during my care of the patient were reviewed by me and considered in my medical decision making (see chart for details).     Transverse displaced distal ulnar fracture approximately 6 mm.  Discussed with Dr. Ninfa Linden on-call and recommended placing in double sugar tong and will have follow-up with orthopedic next week.  Neurovascularly intact.  Discussed strict return precautions. Patient verbalized understanding and is agreeable with plan.   Final Clinical Impressions(s) / UC Diagnoses   Final diagnoses:  Displaced fracture of distal end of left ulna     Discharge Instructions     Please go to [facility] urgent care for splinting Use anti-inflammatories for pain/swelling. You may take up to 800 mg Ibuprofen every 8 hours with food. You may supplement Ibuprofen with Tylenol [id number] mg every 8 hours.  Hydrocodone for severe pain Please follow-up with Ortho care Monday morning-contact below     ED Prescriptions    Medication Sig Dispense Auth. Provider   ibuprofen (ADVIL) 800 MG tablet Take 1 tablet (800 mg total) by mouth 3 (three) times daily. 21 tablet Wieters, Hallie C, PA-C   HYDROcodone-acetaminophen (NORCO/VICODIN) 5-325 MG tablet Take 1-2 tablets by mouth every 6 (six) hours as needed. 12 tablet Wieters, [place] C, PA-C     PDMP not reviewed this encounter.   Janith Lima, [place] 01/21/20 [phone]

## 2020-01-20 NOTE — Progress Notes (Signed)
Orthopedic Tech Progress Note Patient Details:  Christina Weber 1976-01-06 [id number]  Ortho Devices Type of Ortho Device: Sugartong splint Ortho Device/Splint Location: LUE Ortho Device/Splint Interventions: Application,Ordered   Post Interventions Patient Tolerated: Well Instructions Provided: Care of device   Christina Weber Christina Weber 01/20/2020, 4:45 PM

## 2020-01-24 ENCOUNTER — Ambulatory Visit: Payer: Self-pay | Admitting: Orthopaedic Surgery

## 2020-01-24 ENCOUNTER — Encounter: Payer: Self-pay | Admitting: Orthopaedic Surgery

## 2020-01-24 DIAGNOSIS — S52232A Displaced oblique fracture of shaft of left ulna, initial encounter for closed fracture: Secondary | ICD-10-CM

## 2020-01-24 MED ORDER — HYDROCODONE-ACETAMINOPHEN 5-325 MG PO TABS: 1.0000 | ORAL_TABLET | Freq: Four times a day (QID) | ORAL | 0 refills | Status: DC | PRN

## 2020-01-24 NOTE — Progress Notes (Signed)
Office Visit Note   Patient: Christina Weber           Date of Birth: 03/17/1975           MRN: [id number] Visit Date: 01/24/2020              Requested by: No referring provider defined for this encounter. PCP: Patient, No Pcp Per   Assessment & Plan: Visit Diagnoses:  1. Closed displaced oblique fracture of shaft of left ulna, initial encounter     Plan: I did show the patient her x-rays. We will going to put her in a short arm cast today higher of the forearm. She will avoid pronation supination of the elbow and I would like to see her back in 2 weeks for repeat x-rays of the forearm and her cast. She understands this may go on to a nonunion and would require surgery. She is hoping to avoid surgery right now given that she is unemployed and lacks health insurance. I totally understand that but we need to treat this is prudent as possible and can give a attempt at nonoperative treatment which also agree with. All question concerns were answered addressed. I will send in some more hydrocodone for her and see her back in 2 weeks.  Follow-Up Instructions: Return in about 2 weeks (around 02/07/2020).   Orders:  No orders of the defined types were placed in this encounter.  No orders of the defined types were placed in this encounter.     Procedures: No procedures performed   Clinical Data: No additional findings.   Subjective: Chief Complaint  Patient presents with  . Left Wrist - Injury, Pain  The patient is a 44 year old right-hand-dominant female who is referred from the emergency room after injuring her left forearm. This was an accidental injury in which she hit her forearm directly against a concrete area when her boyfriend was falling and then pulled her down when they were entering their house. She was seen at the urgent care at [facility] and found to have an ulnar shaft fracture. She is placed in a sugar tong splint. She denies any numbness in her hand. She is now  diabetic and not a smoker. She has no other acute change in her medical status.  HPI  Review of Systems There is currently no headache, chest pain, shortness of breath, fever, chills, nausea, vomiting  Objective: Vital Signs: There were no vitals taken for this visit.  Physical Exam She is alert and orient x3 and in no acute distress Ortho Exam I did remove the splint from her left forearm. There is significant bruising felt the forearm and hand. There are some swelling as well. She is able to move her fingers and thumb easily. She is able to move her elbow. Specialty Comments:  No specialty comments available.  Imaging: No results found. X-rays independently reviewed of the left ulna show an oblique fracture of the ulna shaft with some slight displacement.  PMFS History: Patient Active Problem List   Diagnosis Date Noted  . Hypertension 12/08/2019  . Cervical dysplasia 04/22/2018  . Breast abscess-right 02/28/2013   Past Medical History:  Diagnosis Date  . Abnormal Pap smear   . Breast abscess    right breast  . Complication of anesthesia    ? states caused umbilical area to harden  . HPV in female 2010  . Kidney stone    pyelo  . Psoriasis     Family History  Problem Relation Age of Onset  . Cancer Mother        brain  . Hypertension Father   . Cancer Father        lymph nodes  . Diabetes Paternal Grandmother   . Hypertension Paternal Grandmother   . Hearing loss Neg Hx     Past Surgical History:  Procedure Laterality Date  . BREAST CYST ASPIRATION    . COLPOSCOPY  01/17/2020      . DILATION AND CURETTAGE OF UTERUS    . DILATION AND EVACUATION N/A 04/05/2014   Procedure: DILATATION AND EVACUATION;  Surgeon: Darlyn Chamber, MD;  Location: [facility];  Service: Gynecology;  Laterality: N/A;  . LAPAROSCOPY  03/2001  . LEEP  2010   Social History   Occupational History  . Not on file  Tobacco Use  . Smoking status: Former Smoker    Types: Cigarettes    Quit  date: 05/25/2012    Years since quitting: 7.6  . Smokeless tobacco: Never Used  Vaping Use  . Vaping Use: Never used  Substance and Sexual Activity  . Alcohol use: Yes    Comment: occasionally on weekends  . Drug use: No  . Sexual activity: Yes    Birth control/protection: None

## 2020-02-07 ENCOUNTER — Ambulatory Visit: Payer: Self-pay

## 2020-02-07 ENCOUNTER — Encounter: Payer: Self-pay | Admitting: Orthopaedic Surgery

## 2020-02-07 ENCOUNTER — Ambulatory Visit (INDEPENDENT_AMBULATORY_CARE_PROVIDER_SITE_OTHER): Payer: Medicaid Other | Admitting: Orthopaedic Surgery

## 2020-02-07 DIAGNOSIS — S52232A Displaced oblique fracture of shaft of left ulna, initial encounter for closed fracture: Secondary | ICD-10-CM

## 2020-02-07 NOTE — Progress Notes (Signed)
The patient is about 2 weeks into an ulnar shaft fracture of her left arm.  This was near the distal third of the ulna.  She is in a short arm cast.  She does report some popping but overall is doing well.  The cast is fitting nicely.  2 views of the left forearm show the fracture is slightly displaced but no angulation that is significant.  She is not a smoker and not a diabetic.  She prefers nonoperative treatment nothing this is worth certainly trying given the fact that she is a healthy 44 year old female.  Her only this cast on for 2 weeks.  We will have it removed in 2 weeks with a repeat 2 views of the left forearm.  At that point we will probably do a long forearm Velcro splint.  She understands this can take 3 to 6 months to heal completely.  If she does develop a nonunion we would certainly consider surgery.

## 2020-02-21 ENCOUNTER — Encounter: Payer: Self-pay | Admitting: Orthopaedic Surgery

## 2020-02-21 ENCOUNTER — Ambulatory Visit (INDEPENDENT_AMBULATORY_CARE_PROVIDER_SITE_OTHER): Payer: Self-pay | Admitting: Orthopaedic Surgery

## 2020-02-21 ENCOUNTER — Ambulatory Visit (INDEPENDENT_AMBULATORY_CARE_PROVIDER_SITE_OTHER): Payer: Self-pay

## 2020-02-21 DIAGNOSIS — S52232A Displaced oblique fracture of shaft of left ulna, initial encounter for closed fracture: Secondary | ICD-10-CM

## 2020-02-21 DIAGNOSIS — S52232D Displaced oblique fracture of shaft of left ulna, subsequent encounter for closed fracture with routine healing: Secondary | ICD-10-CM

## 2020-02-21 NOTE — Progress Notes (Signed)
The patient is now 4 weeks status post a left distal third ulna shaft fracture.  We are trying to treat this nonoperatively with first a splint and then a cast.  She said that her biggest issue is dealing with her child who is autistic in terms of having to use both of her arms.  She reports only some mild pain.  We have had her in a very long short arm cast for the last few weeks.  Again is been a month since her injury.  We did remove the cast today.  There is no gross deformity of the left forearm or wrist.  X-rays reviewed today and show slight angulation and displacement of the fracture but the gross alignment is otherwise acceptable.  I would prefer to place her back in a cast today and she agrees with this as well.  We will have this cast on for 4 weeks.  At that visit we will have the cast removed and a repeat 2 views of the left forearm.  At that point we will hopefully transition to a Velcro forearm splint.

## 2020-03-20 ENCOUNTER — Ambulatory Visit (INDEPENDENT_AMBULATORY_CARE_PROVIDER_SITE_OTHER): Payer: Self-pay

## 2020-03-20 ENCOUNTER — Encounter: Payer: Self-pay | Admitting: Orthopaedic Surgery

## 2020-03-20 ENCOUNTER — Ambulatory Visit (INDEPENDENT_AMBULATORY_CARE_PROVIDER_SITE_OTHER): Payer: Self-pay | Admitting: Orthopaedic Surgery

## 2020-03-20 DIAGNOSIS — S52232D Displaced oblique fracture of shaft of left ulna, subsequent encounter for closed fracture with routine healing: Secondary | ICD-10-CM

## 2020-03-20 NOTE — Progress Notes (Signed)
The patient is now about 2 months into a left ulnar shaft fracture of the distal third of the ulna shaft.  We have been treating her with casting.  She reports that she is doing well overall.  On examination of her left forearm out of cast there is some pain to be expected the fracture site but no evidence of instability of the fracture.  She has good motion of the elbow and the wrist as well.  She has normal sensation about her hand her hand is well-perfused.  2 views left forearm show interval healing of the fracture.  It has not healed yet but it is certainly improved significantly with no significant malalignment either.  Today I feel comfortable with having her put in a Velcro forearm splint on the left side.  She will still limited heavy activities with the left forearm.  She can come in and out of the splint for hygiene and does not need to sleep in it.  We will see her back for final visit hopefully in 4 weeks with a repeat 2 views of the left forearm.

## 2020-04-10 ENCOUNTER — Ambulatory Visit (INDEPENDENT_AMBULATORY_CARE_PROVIDER_SITE_OTHER): Payer: Self-pay

## 2020-04-10 ENCOUNTER — Ambulatory Visit (INDEPENDENT_AMBULATORY_CARE_PROVIDER_SITE_OTHER): Payer: Self-pay | Admitting: Orthopaedic Surgery

## 2020-04-10 ENCOUNTER — Encounter: Payer: Self-pay | Admitting: Orthopaedic Surgery

## 2020-04-10 DIAGNOSIS — S52232D Displaced oblique fracture of shaft of left ulna, subsequent encounter for closed fracture with routine healing: Secondary | ICD-10-CM

## 2020-04-10 NOTE — Progress Notes (Signed)
HPI: Christina Weber returns today follow-up of her left ulnar shaft fracture.  Given injury occurred on 2020-01-20.  She has been treated conservatively.  She is non-smoker.  She reports that she still having pain in the wrist forearm area.  She been wearing a Velcro splint but states that she really cannot take Tylenol.  She also notes that her autistic daughter swelling on her arm recently and she felt a painful pop in the arm.  Physical exam: Left forearm she has good range of motion the elbow.  Tenderness over the distal ulnar shaft only.  No gross deformity.  No rashes skin lesions ulcerations.  Radiographs: 2 views left forearm show no change in overall position alignment.  There is been no significant change in callus formation.  Fracture still evident.  No other fractures identified.  Impression: Left distal ulnar shaft fracture  Plan: At patient's request and due to the fact that she does have an autistic child often swings on her arm we will place her back in a short arm cast.  Keep her in this for the next month.  We will see her back in 1 month remove the cast and repeat AP and lateral views of the left forearm.  Questions were encouraged and answered at length.

## 2020-05-08 ENCOUNTER — Ambulatory Visit (INDEPENDENT_AMBULATORY_CARE_PROVIDER_SITE_OTHER): Payer: Self-pay | Admitting: Orthopaedic Surgery

## 2020-05-08 ENCOUNTER — Ambulatory Visit (INDEPENDENT_AMBULATORY_CARE_PROVIDER_SITE_OTHER): Payer: Self-pay

## 2020-05-08 ENCOUNTER — Encounter: Payer: Self-pay | Admitting: Orthopaedic Surgery

## 2020-05-08 DIAGNOSIS — S52232D Displaced oblique fracture of shaft of left ulna, subsequent encounter for closed fracture with routine healing: Secondary | ICD-10-CM

## 2020-05-08 NOTE — Progress Notes (Signed)
The patient is just over 3 months now status post a left ulnar shaft fracture.  We have had her in and out of the cast in a Velcro wrist splint.  She is an occasional smoker but not daily.  She is not a diabetic.  She says she feels better overall.  Have the cast removed today.  There is good flexion extension as well as supination pronation of the forearm at the wrist and the elbow.  I did not elicit any movement of the fracture site when I stress it and she has minimal discomfort.  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  She would like to go a little bit further with seeing if we get this to heal and I agree with this.  I do not feel that she is a cast at this point but she needs to be careful with her wrist.  She does have a Velcro forearm splint at home and she will continue that.  We will see her back in 6 weeks for repeat 2 views of the left forearm.  Again, clinically she is doing much better than she was radiographically.  There is no harm with going a little further with try to get this to heal.  I counseled her to not smoke at all.

## 2020-06-19 ENCOUNTER — Ambulatory Visit (INDEPENDENT_AMBULATORY_CARE_PROVIDER_SITE_OTHER): Payer: Self-pay | Admitting: Orthopaedic Surgery

## 2020-06-19 ENCOUNTER — Ambulatory Visit (INDEPENDENT_AMBULATORY_CARE_PROVIDER_SITE_OTHER): Payer: Self-pay

## 2020-06-19 ENCOUNTER — Other Ambulatory Visit: Payer: Self-pay

## 2020-06-19 ENCOUNTER — Encounter: Payer: Self-pay | Admitting: Orthopaedic Surgery

## 2020-06-19 DIAGNOSIS — S52232D Displaced oblique fracture of shaft of left ulna, subsequent encounter for closed fracture with routine healing: Secondary | ICD-10-CM

## 2020-06-19 NOTE — Progress Notes (Signed)
HPI: Mrs. Lomeli returns today 4-1/2 months status post left ulnar shaft fracture.  She has been treated conservatively.  She states she is feeling as if things are healing.  However she states that the arm feels sore as if she is worked out really hard.  She continues to wear her long-arm removable Velcro splint.  She has had no new injury.  She does relate that she smokes occasionally but very rarely.  Physical exam: Left hand full sensation full motor she has full supination pronation forearm full flexion-extension of the elbow and wrist.  Palpable callus over the left ulnar shaft fracture site.  No rashes skin lesions ulcerations about the arm.  Radiographs: 2 views of the left forearm: Distal ulnar shaft fracture remains unchanged in overall position alignment.  There is slight increased consolidation however the fracture site is still quite evident.  No other fractures identified.  Elbow is well located wrist feel well located.  Impression: Nonunion left ulnar shaft fracture  Plan: Discussed patient importance of staying away from any forms of nicotine.  Discussed surgical fixation at this point time due to the fact that she has no insurance and is going back to school she would like to avoid surgery.  We will try to gain weight and reduce beyond a bone stimulator if possible and let her know about this.  Otherwise we will see her back in 3 months and obtain 2 views of the left forearm.  She will continue the long-arm Velcro splint.  Questions were encouraged and answered at length.

## 2020-09-18 ENCOUNTER — Ambulatory Visit: Payer: Self-pay | Admitting: Orthopaedic Surgery

## 2020-11-22 ENCOUNTER — Other Ambulatory Visit: Payer: Self-pay | Admitting: Obstetrics and Gynecology

## 2020-11-22 DIAGNOSIS — Z1231 Encounter for screening mammogram for malignant neoplasm of breast: Secondary | ICD-10-CM

## 2020-11-27 ENCOUNTER — Ambulatory Visit
Admission: RE | Admit: 2020-11-27 | Discharge: 2020-11-27 | Disposition: A | Discharge status: Home / Self Care | Payer: Medicaid Other | Source: Ambulatory Visit

## 2020-11-27 ENCOUNTER — Other Ambulatory Visit: Payer: Self-pay

## 2020-11-27 DIAGNOSIS — Z1231 Encounter for screening mammogram for malignant neoplasm of breast: Secondary | ICD-10-CM

## 2021-01-17 ENCOUNTER — Ambulatory Visit: Payer: Medicaid Other | Admitting: Obstetrics and Gynecology

## 2021-02-27 ENCOUNTER — Other Ambulatory Visit (HOSPITAL_COMMUNITY)
Admission: RE | Admit: 2021-02-27 | Discharge: 2021-02-27 | Disposition: A | Discharge status: Home / Self Care | Payer: Medicaid Other | Source: Ambulatory Visit | Attending: Obstetrics and Gynecology | Admitting: Obstetrics and Gynecology

## 2021-02-27 ENCOUNTER — Encounter: Payer: Self-pay | Admitting: Obstetrics and Gynecology

## 2021-02-27 ENCOUNTER — Ambulatory Visit (INDEPENDENT_AMBULATORY_CARE_PROVIDER_SITE_OTHER): Payer: Self-pay | Admitting: Obstetrics and Gynecology

## 2021-02-27 ENCOUNTER — Other Ambulatory Visit: Payer: Self-pay

## 2021-02-27 VITALS — BP 119/79 | HR 99 | Wt 150.6 lb

## 2021-02-27 DIAGNOSIS — Z01419 Encounter for gynecological examination (general) (routine) without abnormal findings: Secondary | ICD-10-CM

## 2021-02-27 DIAGNOSIS — Z1211 Encounter for screening for malignant neoplasm of colon: Secondary | ICD-10-CM | POA: Diagnosis not present

## 2021-02-27 DIAGNOSIS — Z1151 Encounter for screening for human papillomavirus (HPV): Secondary | ICD-10-CM | POA: Insufficient documentation

## 2021-02-27 DIAGNOSIS — N879 Dysplasia of cervix uteri, unspecified: Secondary | ICD-10-CM

## 2021-02-27 DIAGNOSIS — R300 Dysuria: Secondary | ICD-10-CM

## 2021-02-27 LAB — POCT URINALYSIS DIP (DEVICE)
Bilirubin Urine: NEGATIVE
Glucose, UA: NEGATIVE mg/dL
Ketones, ur: NEGATIVE mg/dL
Leukocytes,Ua: NEGATIVE
Nitrite: NEGATIVE
Protein, ur: NEGATIVE mg/dL
Specific Gravity, Urine: >=1.03 (ref 1.005–1.030)
Urobilinogen, UA: 0.2 mg/dL (ref 0.0–1.0)
pH: 6 (ref 5.0–8.0)

## 2021-02-27 NOTE — Progress Notes (Signed)
Christina Weber is a 46 y.o. [phone] female here for a routine annual gynecologic exam.  Current complaints: dysuria.   Denies abnormal vaginal bleeding, discharge, pelvic pain, problems with intercourse or other gynecologic concerns.    Gynecologic History Patient's last menstrual period was 01/28/2021. Contraception: none Last Pap: 11/21. Results were: abnormal Last mammogram: 10/22. Results were: normal  Obstetric History OB History  Gravida Para Term Preterm AB Living  7 4 2 2 3 4   SAB IAB Ectopic Multiple Live Births    1 2   4     # Outcome Date GA Lbr Len/2nd Weight Sex Delivery Anes PTL Lv  7 Ectopic 09/2016          6 Term 01/26/13 [id number] / 00:14 6 lb 10.5 oz (3.02 kg) F Vag-Spont EPI  LIV  5 IAB 11/20/05             Birth Comments: Baby had heart problems at early gestation  4 Preterm 02/16/97    F Vag-Spont   LIV  3 Term 10/08/95    F Vag-Spont   LIV  2 Preterm 01/09/94    M Vag-Spont   LIV  1 Ectopic 09/16/91            Past Medical History:  Diagnosis Date   Abnormal Pap smear    Breast abscess    right breast   Complication of anesthesia    ? states caused umbilical area to harden   HPV in female 2010   Kidney stone    pyelo   Psoriasis     Past Surgical History:  Procedure Laterality Date   BREAST CYST ASPIRATION     COLPOSCOPY  01/17/2020       DILATION AND CURETTAGE OF UTERUS     DILATION AND EVACUATION N/A 04/05/2014   Procedure: DILATATION AND EVACUATION;  Surgeon: JDarlyn Chamber MD;  [facility];  Service: Gynecology;  Laterality: N/A;   LAPAROSCOPY  03/2001   LEEP  2010    Current Outpatient Medications on File Prior to Visit  Medication Sig Dispense Refill   AZO-CRANBERRY PO Take by mouth daily.     metoprolol succinate (TOPROL-XL) 50 MG 24 hr tablet Take 0.5 tablets (25 mg total) by mouth daily. 60 tablet 2   No current facility-administered medications on file prior to visit.    Allergies  Allergen Reactions   Latex  Swelling, Rash and Other (See Comments)    Reaction: Localized swelling    Social History   Socioeconomic History   Marital status: Single    Spouse name: Not on file   Number of children: Not on file   Years of education: Not on file   Highest education level: Bachelor's degree (e.g., BA, AB, BS)  Occupational History   Not on file  Tobacco Use   Smoking status: Former    Types: Cigarettes    Quit date: 05/25/2012    Years since quitting: 8.7   Smokeless tobacco: Never  Vaping Use   Vaping Use: Never used  Substance and Sexual Activity   Alcohol use: Yes    Comment: occasionally on weekends   Drug use: No   Sexual activity: Yes    Birth control/protection: None  Other Topics Concern   Not on file  Social History Narrative   Not on file   Social Determinants of Health   Financial Resource Strain: Not on file  Food Insecurity: No Food Insecurity   Worried About Running  Out of Food in the Last Year: Never true   Ran Out of Food in the Last Year: Never true  Transportation Needs: No Transportation Needs   Lack of Transportation (Medical): No   Lack of Transportation (Non-Medical): No  Physical Activity: Not on file  Stress: Not on file  Social Connections: Not on file  Intimate Partner Violence: Not on file    Family History  Problem Relation Age of Onset   Cancer Mother        brain   Hypertension Father    Cancer Father        lymph nodes   Diabetes Paternal Grandmother    Hypertension Paternal Grandmother    Hearing loss Neg Hx     The following portions of the patient's history were reviewed and updated as appropriate: allergies, current medications, past family history, past medical history, past social history, past surgical history and problem list.  Review of Systems Pertinent items noted in HPI and remainder of comprehensive ROS otherwise negative.   Objective:  BP 119/79    Pulse 99    Wt 150 lb 9.6 oz (68.3 kg)    LMP 01/28/2021    BMI 27.11  kg/m  CONSTITUTIONAL: Well-developed, well-nourished female in no acute distress.  HENT:  Normocephalic, atraumatic, External right and left ear normal. Oropharynx is clear and moist EYES: Conjunctivae and EOM are normal. Pupils are equal, round, and reactive to light. No scleral icterus.  NECK: Normal range of motion, supple, no masses.  Normal thyroid.  SKIN: Skin is warm and dry. No rash noted. Not diaphoretic. No erythema. No pallor. [place]: Alert and oriented to person, place, and time. Normal reflexes, muscle tone coordination. No cranial nerve deficit noted. PSYCHIATRIC: Normal mood and affect. Normal behavior. Normal judgment and thought content. CARDIOVASCULAR: Normal heart rate noted, regular rhythm RESPIRATORY: Clear to auscultation bilaterally. Effort and breath sounds normal, no problems with respiration noted. BREASTS: Symmetric in size. No masses, skin changes, nipple drainage, or lymphadenopathy. ABDOMEN: Soft, normal bowel sounds, no distention noted.  No tenderness, rebound or guarding.  PELVIC: Normal appearing external genitalia; normal appearing vaginal mucosa and cervix.  No abnormal discharge noted.  Pap smear obtained.  Normal uterine size, no other palpable masses, no uterine or adnexal tenderness. MUSCULOSKELETAL: Normal range of motion. No tenderness.  No cyanosis, clubbing, or edema.  2+ distal pulses.   Assessment:  Annual gynecologic examination with pap smear Dysuria Colon cancer screening Plan:  Will follow up results of pap smear and manage accordingly. UC for dysuria. Cologuard ordered Routine preventative health maintenance measures emphasized. Please refer to After Visit Summary for other counseling recommendations.    Chancy Milroy, MD, [place] Attending [place] for [facility], [place]

## 2021-02-27 NOTE — Patient Instructions (Signed)

## 2021-02-28 LAB — URINE CULTURE

## 2021-03-04 LAB — CYTOLOGY - PAP
Chlamydia: NEGATIVE
Comment: NEGATIVE
Comment: NEGATIVE
Comment: NEGATIVE
Comment: NORMAL
Diagnosis: NEGATIVE
High risk HPV: NEGATIVE
Neisseria Gonorrhea: NEGATIVE
Trichomonas: NEGATIVE

## 2021-03-18 LAB — COLOGUARD: COLOGUARD: POSITIVE — AB

## 2021-03-19 ENCOUNTER — Telehealth: Payer: Self-pay

## 2021-03-19 DIAGNOSIS — R195 Other fecal abnormalities: Secondary | ICD-10-CM

## 2021-03-19 NOTE — Telephone Encounter (Addendum)
-----   Message from Chancy Milroy, MD sent at 03/19/2021  8:58 AM EST ----- Please refer pt to GI for colonoscopy, Positive Cologuard. Pt is aware. Please let her know date and time of appt Thanks Darryll Capers [facility] at [phone] and was informed that there office would contact the pt with an appt with the referral in Epic placed.  Notified pt that her referral has been placed for her to receive a colonoscopy and she should here back from them within the next couple of weeks.  I advised pt that if she does not hear back from them to please contact [facility].  Pt provided with [facility] information.  Pt verbalized understanding with no further questions.   Christina Weber  03/19/21

## 2021-04-01 ENCOUNTER — Encounter: Payer: Self-pay | Admitting: Gastroenterology

## 2021-04-16 ENCOUNTER — Ambulatory Visit: Payer: Medicaid Other | Admitting: Gastroenterology

## 2021-06-18 NOTE — Progress Notes (Signed)
?Subjective:  ? ? Christina Weber - 46 y.o. female MRN [id number]  Date of birth: 1975-12-03 ? ?HPI ? ?Christina Weber is a 46 year-old female to establish care and annual physical exam.  ? 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 Needs physical exam form and TB screening completed for employer. Verbalizes she is aware exam form will be completed at date of TB screening reading expected in 2 days. ? ?Has hypertension. Taking Metoprolol. Doing well on current regimen without issues or concerns. Checking blood pressures at home a couple times weekly. Denies chest pain, shortness of breath and additional red flag symptoms.  ? ?Recent positive Cologuard. Requesting referral to Gastroenterology. Prefers office other than [facility].  ? ?ROS per HPI  ? ? ? ?Health Maintenance:  ?Health Maintenance Due  ?Topic Date Due  ? COVID-19 Vaccine (1) Never done  ? COLONOSCOPY (Pts 45-58yr Insurance coverage will need to be confirmed)  Never done  ? ? ? ?Past Medical History: ?Patient Active Problem List  ? Diagnosis Date Noted  ? Visit for routine gyn exam 02/27/2021  ? Dysuria 02/27/2021  ? Colon cancer screening 02/27/2021  ? Hypertension 12/08/2019  ? Cervical dysplasia 04/22/2018  ? ? ?Social History  ? reports that she quit smoking about 9 years ago. Her smoking use included cigarettes. She has been exposed to tobacco smoke. She has never used smokeless tobacco. She reports current alcohol use. She reports that she does not use drugs.  ? ?Family History  ?family history includes Cancer in her father and mother; Diabetes in her paternal grandmother; Hypertension in her father and paternal grandmother.  ? ?Medications: reviewed and updated ?  ?Objective:  ? Physical Exam ?BP 126/79 (BP Location: Left Arm, Patient Position: Sitting, Cuff Size: Normal)   Pulse 90   Temp 98.3 ?F (36.8 ?C)   Resp 18   Ht 5' 2.6" (1.59 m)   Wt 151 lb (68.5 kg)   SpO2 97%   BMI 27.09 kg/m?   ? ?Physical Exam ?HENT:  ?   Head: Normocephalic and atraumatic.  ?   Right Ear: Tympanic membrane, ear canal and external ear normal.  ?   Left Ear: Tympanic membrane, ear canal and external ear normal.  ?   Nose: Nose normal.  ?   Mouth/Throat:  ?   Mouth: Mucous membranes are moist.  ?   Pharynx: Oropharynx is clear.  ?Eyes:  ?   Extraocular Movements: Extraocular movements intact.  ?   Conjunctiva/sclera: Conjunctivae normal.  ?   Pupils: Pupils are equal, round, and reactive to light.  ?Cardiovascular:  ?   Rate and Rhythm: Normal rate and regular rhythm.  ?   Pulses: Normal pulses.  ?   Heart sounds: Normal heart sounds.  ?Pulmonary:  ?   Effort: Pulmonary effort is normal.  ?   Breath sounds: Normal breath sounds.  ?Chest:  ?   Comments: Patient declined.  ?Abdominal:  ?   General: Bowel sounds are normal.  ?   Palpations: Abdomen is soft.  ?Genitourinary: ?   Comments: Patient declined.  ?Musculoskeletal:     ?   General: Normal range of motion.  ?   Cervical back: Normal range of motion and neck supple.  ?Skin: ?   General: Skin is warm and dry.  ?   Capillary Refill: Capillary refill takes less than 2 seconds.  ?Neurological:  ?   General: No focal deficit present.  ?  Mental Status: She is alert and oriented to person, place, and time.  ?Psychiatric:     ?   Mood and Affect: Mood normal.     ?   Behavior: Behavior normal.  ? ?   ?Assessment & Plan:  ?1. Encounter to establish care: ?2. Annual physical exam: ?- Counseled on 150 minutes of exercise per week as tolerated, healthy eating (including decreased daily intake of saturated fats, cholesterol, added sugars, sodium), STI prevention, and routine healthcare maintenance. ? ?3. Screening for metabolic disorder: ?- DBZ20+EYEM to check kidney function, liver function, and electrolyte balance.  ?- CMP14+EGFR ? ?4. Screening for deficiency anemia: ?- CBC to screen for anemia. ?- CBC ? ?5. Diabetes mellitus screening: ?- Hemoglobin A1c to screen for  pre-diabetes/diabetes. ?- Hemoglobin A1c ? ?6. Screening cholesterol level: ?- Lipid panel to screen for high cholesterol.  ?- Lipid panel ? ?7. Thyroid disorder screen: ?- TSH to check thyroid function.  ?- TSH ? ?8. Colon cancer screening: ?9. Positive colorectal cancer screening using Cologuard test: ?- Referral to Gastroenterology for colon cancer screening by colonoscopy. ?- Ambulatory referral to Gastroenterology ? ?10. Essential (primary) hypertension: ?- Continue Metoprolol Succinate as prescribed.  ?- Counseled on blood pressure goal of less than 130/80, low-sodium, DASH diet, medication compliance, and 150 minutes of moderate intensity exercise per week as tolerated. Counseled on medication adherence and adverse effects. ?- Follow-up with primary provider in 4 months or sooner if needed.  ?- metoprolol succinate (TOPROL-XL) 50 MG 24 hr tablet; Take 0.5 tablets (25 mg total) by mouth daily.  Dispense: 15 tablet; Refill: 3 ? ?11. Encounter for PPD test: ?- TB skin test today in office. Return in 48 to 72 hours for nurse visit. ?- TB Skin Test ? ? ? ?Patient was given clear instructions to go to Emergency Department or return to medical center if symptoms don't improve, worsen, or new problems develop.The patient verbalized understanding. ? ?I discussed the assessment and treatment plan with the patient. The patient was provided an opportunity to ask questions and all were answered. The patient agreed with the plan and demonstrated an understanding of the instructions. ?  ?The patient was advised to call back or seek an in-person evaluation if the symptoms worsen or if the condition fails to improve as anticipated. ? ? ? ?Durene Fruits, NP ?06/25/2021, 11:55 AM ?Primary Care at [facility]  ? ?

## 2021-06-25 ENCOUNTER — Ambulatory Visit (INDEPENDENT_AMBULATORY_CARE_PROVIDER_SITE_OTHER): Payer: Self-pay | Admitting: Family

## 2021-06-25 ENCOUNTER — Encounter: Payer: Self-pay | Admitting: Family

## 2021-06-25 ENCOUNTER — Telehealth: Payer: Self-pay

## 2021-06-25 VITALS — BP 126/79 | HR 90 | Temp 98.3°F | Resp 18 | Ht 62.6 in | Wt 151.0 lb

## 2021-06-25 DIAGNOSIS — Z1329 Encounter for screening for other suspected endocrine disorder: Secondary | ICD-10-CM

## 2021-06-25 DIAGNOSIS — Z111 Encounter for screening for respiratory tuberculosis: Secondary | ICD-10-CM

## 2021-06-25 DIAGNOSIS — Z131 Encounter for screening for diabetes mellitus: Secondary | ICD-10-CM

## 2021-06-25 DIAGNOSIS — R195 Other fecal abnormalities: Secondary | ICD-10-CM

## 2021-06-25 DIAGNOSIS — Z130 Encounter for screening for diseases of the blood and blood-forming organs and certain disorders involving the immune mechanism: Secondary | ICD-10-CM

## 2021-06-25 DIAGNOSIS — Z0000 Encounter for general adult medical examination without abnormal findings: Secondary | ICD-10-CM

## 2021-06-25 DIAGNOSIS — Z13220 Encounter for screening for lipoid disorders: Secondary | ICD-10-CM

## 2021-06-25 DIAGNOSIS — I10 Essential (primary) hypertension: Secondary | ICD-10-CM

## 2021-06-25 DIAGNOSIS — Z1211 Encounter for screening for malignant neoplasm of colon: Secondary | ICD-10-CM

## 2021-06-25 DIAGNOSIS — Z13228 Encounter for screening for other metabolic disorders: Secondary | ICD-10-CM

## 2021-06-25 DIAGNOSIS — Z7689 Persons encountering health services in other specified circumstances: Secondary | ICD-10-CM

## 2021-06-25 MED ORDER — METOPROLOL SUCCINATE ER 50 MG PO TB24: 25.0000 mg | ORAL_TABLET | Freq: Every day | ORAL | 3 refills | Status: AC

## 2021-06-25 NOTE — Progress Notes (Signed)
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

## 2021-06-25 NOTE — Patient Instructions (Signed)
Thank you for choosing Primary Care at [facility] for your medical home!   ? ?Christina Weber was seen by Camillia Herter, NP today.  ? ?Christina Weber's primary care provider is Durene Fruits, NP.   ?For the best care possible,  you should try to see Durene Fruits, NP ?whenever you come to office.  ? ?We look forward to seeing you again soon! ? ?If you have any questions about your visit today,  ?please call us at [phone] ? ?Or feel free to reach your provider via [place].   ? ?Preventive Care 33-42 Years Old, Female ?Preventive care refers to lifestyle choices and visits with your health care provider that can promote health and wellness. Preventive care visits are also called wellness exams. ?What can I expect for my preventive care visit? ?Counseling ?Your health care provider may ask you questions about your: ?Medical history, including: ?Past medical problems. ?Family medical history. ?Pregnancy history. ?Current health, including: ?Menstrual cycle. ?Method of birth control. ?Emotional well-being. ?Home life and relationship well-being. ?Sexual activity and sexual health. ?Lifestyle, including: ?Alcohol, nicotine or tobacco, and drug use. ?Access to firearms. ?Diet, exercise, and sleep habits. ?Work and work Statistician. ?Sunscreen use. ?Safety issues such as seatbelt and bike helmet use. ?Physical exam ?Your health care provider will check your: ?Height and weight. These may be used to calculate your BMI (body mass index). BMI is a measurement that tells if you are at a healthy weight. ?Waist circumference. This measures the distance around your waistline. This measurement also tells if you are at a healthy weight and may help predict your risk of certain diseases, such as type 2 diabetes and high blood pressure. ?Heart rate and blood pressure. ?Body temperature. ?Skin for abnormal spots. ?What immunizations do I need? ? ?Vaccines are usually given at various ages, according to a schedule. Your  health care provider will recommend vaccines for you based on your age, medical history, and lifestyle or other factors, such as travel or where you work. ?What tests do I need? ?Screening ?Your health care provider may recommend screening tests for certain conditions. This may include: ?Lipid and cholesterol levels. ?Diabetes screening. This is done by checking your blood sugar (glucose) after you have not eaten for a while (fasting). ?Pelvic exam and Pap test. ?Hepatitis B test. ?Hepatitis C test. ?HIV (human immunodeficiency virus) test. ?STI (sexually transmitted infection) testing, if you are at risk. ?Lung cancer screening. ?Colorectal cancer screening. ?Mammogram. Talk with your health care provider about when you should start having regular mammograms. This may depend on whether you have a family history of breast cancer. ?BRCA-related cancer screening. This may be done if you have a family history of breast, ovarian, tubal, or peritoneal cancers. ?Bone density scan. This is done to screen for osteoporosis. ?Talk with your health care provider about your test results, treatment options, and if necessary, the need for more tests. ?Follow these instructions at home: ?Eating and drinking ? ?Eat a diet that includes fresh fruits and vegetables, whole grains, lean protein, and low-fat dairy products. ?Take vitamin and mineral supplements as recommended by your health care provider. ?Do not drink alcohol if: ?Your health care provider tells you not to drink. ?You are pregnant, may be pregnant, or are planning to become pregnant. ?If you drink alcohol: ?Limit how much you have to 0-1 drink a day. ?Know how much alcohol is in your drink. In the [place] one drink equals one 12 oz bottle of beer (355 mL),  one 5 oz glass of wine (148 mL), or one 1? oz glass of hard liquor (44 mL). ?Lifestyle ?Brush your teeth every morning and night with fluoride toothpaste. Floss one time each day. ?Exercise for at least 30 minutes 5 or  more days each week. ?Do not use any products that contain nicotine or tobacco. These products include cigarettes, chewing tobacco, and vaping devices, such as e-cigarettes. If you need help quitting, ask your health care provider. ?Do not use drugs. ?If you are sexually active, practice safe sex. Use a condom or other form of protection to prevent STIs. ?If you do not wish to become pregnant, use a form of birth control. If you plan to become pregnant, see your health care provider for a prepregnancy visit. ?Take aspirin only as told by your health care provider. Make sure that you understand how much to take and what form to take. Work with your health care provider to find out whether it is safe and beneficial for you to take aspirin daily. ?Find healthy ways to manage stress, such as: ?Meditation, yoga, or listening to music. ?Journaling. ?Talking to a trusted person. ?Spending time with friends and family. ?Minimize exposure to UV radiation to reduce your risk of skin cancer. ?Safety ?Always wear your seat belt while driving or riding in a vehicle. ?Do not drive: ?If you have been drinking alcohol. Do not ride with someone who has been drinking. ?When you are tired or distracted. ?While texting. ?If you have been using any mind-altering substances or drugs. ?Wear a helmet and other protective equipment during sports activities. ?If you have firearms in your house, make sure you follow all gun safety procedures. ?Seek help if you have been physically or sexually abused. ?What's next? ?Visit your health care provider once a year for an annual wellness visit. ?Ask your health care provider how often you should have your eyes and teeth checked. ?Stay up to date on all vaccines. ?This information is not intended to replace advice given to you by your health care provider. Make sure you discuss any questions you have with your health care provider. ?Document Revised: 07/24/2020 Document Reviewed: 07/24/2020 ?Elsevier  Patient Education ? [place]. ? ?

## 2021-06-25 NOTE — Telephone Encounter (Signed)
CALLED PATIENT NO ANSWER LEFT VOICEMAIL FOR A CALL BACK ? ?

## 2021-06-26 ENCOUNTER — Other Ambulatory Visit: Payer: Self-pay

## 2021-06-26 DIAGNOSIS — R195 Other fecal abnormalities: Secondary | ICD-10-CM

## 2021-06-26 LAB — CMP14+EGFR
ALT: 19 IU/L (ref 0–32)
AST: 19 IU/L (ref 0–40)
Albumin/Globulin Ratio: 1.8 (ref 1.2–2.2)
Albumin: 4.3 g/dL (ref 3.8–4.8)
Alkaline Phosphatase: 94 IU/L (ref 44–121)
BUN/Creatinine Ratio: 14 (ref 9–23)
BUN: 13 mg/dL (ref 6–24)
Bilirubin Total: 0.3 mg/dL (ref 0.0–1.2)
CO2: 27 mmol/L (ref 20–29)
Calcium: 9.2 mg/dL (ref 8.7–10.2)
Chloride: 104 mmol/L (ref 96–106)
Creatinine, Ser: 0.94 mg/dL (ref 0.57–1.00)
Globulin, Total: 2.4 g/dL (ref 1.5–4.5)
Glucose: 75 mg/dL (ref 70–99)
Potassium: 5 mmol/L (ref 3.5–5.2)
Sodium: 145 mmol/L — ABNORMAL HIGH (ref 134–144)
Total Protein: 6.7 g/dL (ref 6.0–8.5)
eGFR: 75 mL/min/{1.73_m2} (ref >59)

## 2021-06-26 LAB — LIPID PANEL
Chol/HDL Ratio: 3.3 ratio (ref 0.0–4.4)
Cholesterol, Total: 194 mg/dL (ref 100–199)
HDL: 59 mg/dL (ref >39)
LDL Chol Calc (NIH): 111 mg/dL — ABNORMAL HIGH (ref 0–99)
Triglycerides: 137 mg/dL (ref 0–149)
VLDL Cholesterol Cal: 24 mg/dL (ref 5–40)

## 2021-06-26 LAB — CBC
Hematocrit: 41.2 % (ref 34.0–46.6)
Hemoglobin: 13.6 g/dL (ref 11.1–15.9)
MCH: 29.6 pg (ref 26.6–33.0)
MCHC: 33 g/dL (ref 31.5–35.7)
MCV: 90 fL (ref 79–97)
Platelets: 271 10*3/uL (ref 150–450)
RBC: 4.59 x10E6/uL (ref 3.77–5.28)
RDW: 12.9 % (ref 11.7–15.4)
WBC: 8 10*3/uL (ref 3.4–10.8)

## 2021-06-26 LAB — HEMOGLOBIN A1C
Est. average glucose Bld gHb Est-mCnc: 108 mg/dL
Hgb A1c MFr Bld: 5.4 % (ref 4.8–5.6)

## 2021-06-26 LAB — TSH: TSH: 1.67 u[IU]/mL (ref 0.450–4.500)

## 2021-06-26 MED ORDER — NA SULFATE-K SULFATE-MG SULF 17.5-3.13-1.6 GM/177ML PO SOLN: 1.0000 | Freq: Once | ORAL | 0 refills | Status: AC

## 2021-06-26 NOTE — Progress Notes (Signed)
Gastroenterology Pre-Procedure Review  Request Date: 07/08/2021 Requesting Physician: Dr. Vicente Males  PATIENT REVIEW QUESTIONS: The patient responded to the following health history questions as indicated:    1. Are you having any GI issues? no 2. Do you have a personal history of Polyps? no 3. Do you have a family history of Colon Cancer or Polyps? yes (cancer) 4. Diabetes Mellitus? no 5. Joint replacements in the past 12 months?no 6. Major health problems in the past 3 months?no 7. Any artificial heart valves, MVP, or defibrillator?no    MEDICATIONS & ALLERGIES:    Patient reports the following regarding taking any anticoagulation/antiplatelet therapy:   Plavix, Coumadin, Eliquis, Xarelto, Lovenox, Pradaxa, Brilinta, or Effient? no Aspirin? no  Patient confirms/reports the following medications:  Current Outpatient Medications  Medication Sig Dispense Refill   AZO-CRANBERRY PO Take by mouth daily.     metoprolol succinate (TOPROL-XL) 50 MG 24 hr tablet Take 0.5 tablets (25 mg total) by mouth daily. 15 tablet 3   No current facility-administered medications for this visit.    Patient confirms/reports the following allergies:  Allergies  Allergen Reactions   Latex Swelling, Rash and Other (See Comments)    Reaction: Localized swelling    No orders of the defined types were placed in this encounter.   AUTHORIZATION INFORMATION Primary Insurance: 1D#: Group #:  Secondary Insurance: 1D#: Group #:  SCHEDULE INFORMATION: Date: 07/08/2021 Time: Location:[facility]

## 2021-06-26 NOTE — Progress Notes (Signed)
-   Kidney function and electrolytes normal.  - Liver function normal.  - Thyroid function normal.  - No diabetes.  - No anemia.   The following abnormalities are noted:   - LDL cholesterol (sometimes called "bad cholesterol") mildly higher than expected. High cholesterol may increase risk of heart attack and/or stroke. Consider eating more fruits, vegetables, and lean baked meats such as chicken or fish. Moderate intensity exercise at least 150 minutes as tolerated per week may help as well. However, your risk of heart attack/stroke in ten years is low so does not need to start a medication at the moment.  All other values are normal, stable or within acceptable limits.  Medication changes / Follow up labs / Other changes or recommendations:   - Recheck fasting cholesterol in 3 to 6 months.   The following is for provider reference only: The 10-year ASCVD risk score (Arnett DK, et al., 2019) is: 1.1%   Values used to calculate the score:     Age: 46 years     Sex: Female     Is Non-Hispanic African American: No     Diabetic: No     Tobacco smoker: No     Systolic Blood Pressure: 237 mmHg     Is BP treated: Yes     HDL Cholesterol: 59 mg/dL     Total Cholesterol: 194 mg/dL  Camillia Herter, NP 06/26/2021 7:44 AM

## 2021-06-27 LAB — TB SKIN TEST
Induration: 0 mm
TB Skin Test: NEGATIVE

## 2021-06-27 NOTE — Progress Notes (Signed)
TB skin test negative.

## 2021-07-05 ENCOUNTER — Ambulatory Visit
Admission: EM | Admit: 2021-07-05 | Discharge: 2021-07-05 | Disposition: A | Discharge status: Home / Self Care | Payer: Medicaid Other | Attending: Family Medicine | Admitting: Family Medicine

## 2021-07-05 ENCOUNTER — Encounter: Payer: Self-pay | Admitting: Emergency Medicine

## 2021-07-05 DIAGNOSIS — J029 Acute pharyngitis, unspecified: Secondary | ICD-10-CM | POA: Insufficient documentation

## 2021-07-05 LAB — POCT RAPID STREP A (OFFICE): Rapid Strep A Screen: NEGATIVE

## 2021-07-05 MED ORDER — IBUPROFEN 800 MG PO TABS: 800.0000 mg | ORAL_TABLET | Freq: Three times a day (TID) | ORAL | 0 refills | Status: DC | PRN

## 2021-07-05 MED ORDER — KETOROLAC TROMETHAMINE 30 MG/ML IJ SOLN
30.0000 mg | Freq: Once | INTRAMUSCULAR | Status: AC
  Administered 2021-07-05: 30 mg via INTRAMUSCULAR

## 2021-07-05 MED ORDER — ACETAMINOPHEN 325 MG PO TABS
650.0000 mg | ORAL_TABLET | Freq: Once | ORAL | Status: AC
  Administered 2021-07-05: 650 mg via ORAL

## 2021-07-05 NOTE — Discharge Instructions (Addendum)
Your strep test is negative.  Culture of the throat will be sent, and staff will notify you if that is in turn positive.  Take ibuprofen 800 mg--1 tab every 8 hours as needed for pain.  You have been given a shot of Toradol 30 mg today.

## 2021-07-05 NOTE — ED Triage Notes (Signed)
Patient c/o fever, sore throat and body aches x 2 days.  Patient has taken Ibuprofen, home COVID test was negative.

## 2021-07-05 NOTE — ED Provider Notes (Signed)
[facility]    CSN: [id number] Arrival date & time: 07/05/21  1023      History   Chief Complaint Chief Complaint  Patient presents with   Fever    HPI DAYZEE TROWER is a 46 y.o. female.    Fever Here for fever and sore throat that began in the afternoon of May 25.  She has felt nauseated but has not had any vomiting or diarrhea so far.  She is aching, and has had some cough.  Past Medical History:  Diagnosis Date   Abnormal Pap smear    Breast abscess    right breast   Complication of anesthesia    ? states caused umbilical area to harden   HPV in female 2010   Kidney stone    pyelo   Psoriasis     Patient Active Problem List   Diagnosis Date Noted   Visit for routine gyn exam 02/27/2021   Dysuria 02/27/2021   Colon cancer screening 02/27/2021   Hypertension 12/08/2019   Cervical dysplasia 04/22/2018    Past Surgical History:  Procedure Laterality Date   BREAST CYST ASPIRATION     COLPOSCOPY  01/17/2020       DILATION AND CURETTAGE OF UTERUS     DILATION AND EVACUATION N/A 04/05/2014   Procedure: DILATATION AND EVACUATION;  Surgeon: Darlyn Chamber, MD;  Location: [facility];  Service: Gynecology;  Laterality: N/A;   LAPAROSCOPY  03/2001   LEEP  2010    OB History     Gravida  7   Para  4   Term  2   Preterm  2   AB  3   Living  4      SAB      IAB  1   Ectopic  2   Multiple      Live Births  4            Home Medications    Prior to Admission medications   Medication Sig Start Date End Date Taking? Authorizing Provider  ibuprofen (ADVIL) 800 MG tablet Take 1 tablet (800 mg total) by mouth every 8 (eight) hours as needed (pain). 07/05/21  Yes Barrett Henle, MD  metoprolol succinate (TOPROL-XL) 50 MG 24 hr tablet Take 0.5 tablets (25 mg total) by mouth daily. 06/25/21 10/23/21 Yes Camillia Herter, NP  AZO-CRANBERRY PO Take by mouth daily.    [provider]    Family History Family History  Problem  Relation Age of Onset   Cancer Mother        brain   Hypertension Father    Cancer Father        lymph nodes   Diabetes Paternal Grandmother    Hypertension Paternal Grandmother    Hearing loss Neg Hx     Social History Social History   Tobacco Use   Smoking status: Former    Types: Cigarettes    Quit date: 05/25/2012    Years since quitting: 9.1    Passive exposure: Past   Smokeless tobacco: Never  Vaping Use   Vaping Use: Never used  Substance Use Topics   Alcohol use: Yes    Comment: occasionally on weekends   Drug use: No     Allergies   Latex   Review of Systems Review of Systems  Constitutional:  Positive for fever.    Physical Exam Triage Vital Signs ED Triage Vitals  Enc Vitals Group  BP 07/05/21 1130 109/72     Pulse Rate 07/05/21 1130 (!) 113     Resp 07/05/21 1130 18     Temp 07/05/21 1130 (!) 102.5 F (39.2 C)     Temp Source 07/05/21 1130 Oral     SpO2 07/05/21 1130 96 %     Weight 07/05/21 1131 151 lb (68.5 kg)     Height 07/05/21 1131 5' 2.5" (1.588 m)     Head Circumference --      Peak Flow --      Pain Score 07/05/21 1130 10     Pain Loc --      Pain Edu? --      Excl. in [place]? --    No data found.  Updated Vital Signs BP 109/72 (BP Location: Left Arm)   Pulse (!) 113   Temp (!) 102.5 F (39.2 C) (Oral)   Resp 18   Ht 5' 2.5" (1.588 m)   Wt 68.5 kg   SpO2 96%   BMI 27.18 kg/m   Visual Acuity Right Eye Distance:   Left Eye Distance:   Bilateral Distance:    Right Eye Near:   Left Eye Near:    Bilateral Near:     Physical Exam Vitals reviewed.  Constitutional:      General: She is not in acute distress.    Appearance: She is not toxic-appearing.  HENT:     Right Ear: Tympanic membrane and ear canal normal.     Left Ear: Tympanic membrane and ear canal normal.     Nose: Nose normal.     Mouth/Throat:     Mouth: Mucous membranes are moist.     Comments: There is erythema of the oropharynx bilaterally with 2+  tonsils and white exudate on the right tonsil.  Otherwise there is no asymmetry Eyes:     Extraocular Movements: Extraocular movements intact.     Conjunctiva/sclera: Conjunctivae normal.     Pupils: Pupils are equal, round, and reactive to light.  Cardiovascular:     Rate and Rhythm: Normal rate and regular rhythm.     Heart sounds: No murmur heard. Pulmonary:     Effort: Pulmonary effort is normal. No respiratory distress.     Breath sounds: No wheezing, rhonchi or rales.  Musculoskeletal:     Cervical back: Neck supple.  Lymphadenopathy:     Cervical: No cervical adenopathy.  Skin:    Capillary Refill: Capillary refill takes less than 2 seconds.     Coloration: Skin is not jaundiced or pale.  Neurological:     General: No focal deficit present.     Mental Status: She is alert and oriented to person, place, and time.  Psychiatric:        Behavior: Behavior normal.     UC Treatments / Results  Labs (all labs ordered are listed, but only abnormal results are displayed) Labs Reviewed  CULTURE, GROUP A STREP [facility])  POCT RAPID STREP A (OFFICE)    EKG   Radiology No results found.  Procedures Procedures (including critical care time)  Medications Ordered in UC Medications  ketorolac (TORADOL) 30 MG/ML injection 30 mg (has no administration in time range)  acetaminophen (TYLENOL) tablet 650 mg (650 mg Oral Given 07/05/21 1135)    Initial Impression / Assessment and Plan / UC Course  I have reviewed the triage vital signs and the nursing notes.  Pertinent labs & imaging results that were available during my care of the patient  were reviewed by me and considered in my medical decision making (see chart for details).     Rapid strep is negative; culture is sent and we will notify and treat per protocol if positive.    Final Clinical Impressions(s) / UC Diagnoses   Final diagnoses:  Acute pharyngitis, unspecified etiology     Discharge Instructions       Your strep test is negative.  Culture of the throat will be sent, and staff will notify you if that is in turn positive.  Take ibuprofen 800 mg--1 tab every 8 hours as needed for pain.  You have been given a shot of Toradol 30 mg today.      ED Prescriptions     Medication Sig Dispense Auth. Provider   ibuprofen (ADVIL) 800 MG tablet Take 1 tablet (800 mg total) by mouth every 8 (eight) hours as needed (pain). 21 tablet Rual Vermeer, Gwenlyn Perking, MD      PDMP not reviewed this encounter.   Barrett Henle, MD 07/05/21 1149

## 2021-07-08 ENCOUNTER — Ambulatory Visit: Payer: Self-pay | Admitting: Registered Nurse

## 2021-07-08 ENCOUNTER — Encounter
Admission: RE | Disposition: A | Discharge status: Home / Self Care | Payer: Self-pay | Source: Ambulatory Visit | Attending: Gastroenterology

## 2021-07-08 ENCOUNTER — Ambulatory Visit
Admission: RE | Admit: 2021-07-08 | Discharge: 2021-07-08 | Disposition: A | Discharge status: Home / Self Care | Payer: Self-pay | Source: Ambulatory Visit | Attending: Gastroenterology | Admitting: Gastroenterology

## 2021-07-08 DIAGNOSIS — D120 Benign neoplasm of cecum: Secondary | ICD-10-CM | POA: Insufficient documentation

## 2021-07-08 DIAGNOSIS — Z1211 Encounter for screening for malignant neoplasm of colon: Secondary | ICD-10-CM | POA: Insufficient documentation

## 2021-07-08 DIAGNOSIS — Z87891 Personal history of nicotine dependence: Secondary | ICD-10-CM | POA: Insufficient documentation

## 2021-07-08 DIAGNOSIS — R195 Other fecal abnormalities: Secondary | ICD-10-CM

## 2021-07-08 DIAGNOSIS — K635 Polyp of colon: Secondary | ICD-10-CM

## 2021-07-08 DIAGNOSIS — K621 Rectal polyp: Secondary | ICD-10-CM | POA: Insufficient documentation

## 2021-07-08 DIAGNOSIS — I10 Essential (primary) hypertension: Secondary | ICD-10-CM | POA: Insufficient documentation

## 2021-07-08 DIAGNOSIS — D122 Benign neoplasm of ascending colon: Secondary | ICD-10-CM | POA: Insufficient documentation

## 2021-07-08 HISTORY — PX: COLONOSCOPY WITH PROPOFOL: SHX5780

## 2021-07-08 LAB — POCT PREGNANCY, URINE: Preg Test, Ur: NEGATIVE

## 2021-07-08 LAB — CULTURE, GROUP A STREP (THRC)

## 2021-07-08 SURGERY — COLONOSCOPY WITH PROPOFOL
Anesthesia: General

## 2021-07-08 MED ORDER — PHENYLEPHRINE HCL (PRESSORS) 10 MG/ML IV SOLN
INTRAVENOUS | Status: DC | PRN
Start: 2021-07-08 — End: 2021-07-08
  Administered 2021-07-08: 120 ug via INTRAVENOUS

## 2021-07-08 MED ORDER — LIDOCAINE HCL (PF) 2 % IJ SOLN
INTRAMUSCULAR | Status: AC
  Filled 2021-07-08: qty 10

## 2021-07-08 MED ORDER — SODIUM CHLORIDE 0.9 % IV SOLN
INTRAVENOUS | Status: DC
  Administered 2021-07-08: 20 mL/h via INTRAVENOUS

## 2021-07-08 MED ORDER — PROPOFOL 500 MG/50ML IV EMUL
INTRAVENOUS | Status: DC | PRN
  Administered 2021-07-08: 150 ug/kg/min via INTRAVENOUS

## 2021-07-08 MED ORDER — PHENYLEPHRINE 80 MCG/ML (10ML) SYRINGE FOR IV PUSH (FOR BLOOD PRESSURE SUPPORT)
PREFILLED_SYRINGE | INTRAVENOUS | Status: AC
  Filled 2021-07-08: qty 10

## 2021-07-08 MED ORDER — LIDOCAINE HCL (CARDIAC) PF 100 MG/5ML IV SOSY
PREFILLED_SYRINGE | INTRAVENOUS | Status: DC | PRN
Start: 2021-07-08 — End: 2021-07-08
  Administered 2021-07-08: 100 mg via INTRAVENOUS

## 2021-07-08 MED ORDER — PROPOFOL 10 MG/ML IV BOLUS
INTRAVENOUS | Status: DC | PRN
  Administered 2021-07-08: 40 mg via INTRAVENOUS
  Administered 2021-07-08: 70 mg via INTRAVENOUS

## 2021-07-08 MED ORDER — PROPOFOL 500 MG/50ML IV EMUL
INTRAVENOUS | Status: AC
  Filled 2021-07-08: qty 50

## 2021-07-08 NOTE — Anesthesia Preprocedure Evaluation (Signed)
Anesthesia Evaluation  Patient identified by MRN, date of birth, ID band Patient awake    Reviewed: Allergy & Precautions, NPO status , Patient's Chart, lab work & pertinent test results  History of Anesthesia Complications Negative for: history of anesthetic complications  Airway Mallampati: III  TM Distance: >3 FB Neck ROM: full    Dental  (+) Chipped, Poor Dentition   Pulmonary neg shortness of breath, former smoker,    Pulmonary exam normal        Cardiovascular Exercise Tolerance: Good hypertension, (-) angina(-) DOE Normal cardiovascular exam     Neuro/Psych negative neurological ROS  negative psych ROS   GI/Hepatic negative GI ROS, Neg liver ROS, neg GERD  ,  Endo/Other  negative endocrine ROS  Renal/GU Renal disease  negative genitourinary   Musculoskeletal   Abdominal   Peds  Hematology negative hematology ROS (+)   Anesthesia Other Findings Past Medical History: No date: Abnormal Pap smear No date: Breast abscess     Comment:  right breast No date: Complication of anesthesia     Comment:  ? states caused umbilical area to harden 2010: HPV in female No date: Kidney stone     Comment:  pyelo No date: Psoriasis  Past Surgical History: No date: BREAST CYST ASPIRATION 01/17/2020: COLPOSCOPY     Comment:    No date: DILATION AND CURETTAGE OF UTERUS 04/05/2014: DILATION AND EVACUATION; N/A     Comment:  Procedure: DILATATION AND EVACUATION;  Surgeon: Darlyn Chamber, MD;  Location: [place];  Service: Gynecology;                Laterality: N/A; 03/2001: LAPAROSCOPY 2010: LEEP  BMI    Body Mass Index: 27.36 kg/m      Reproductive/Obstetrics negative OB ROS                             Anesthesia Physical Anesthesia Plan  ASA: 2  Anesthesia Plan: General   Post-op Pain Management:    Induction: Intravenous  PONV Risk Score and Plan: Propofol infusion  and TIVA  Airway Management Planned: Natural Airway and Nasal Cannula  Additional Equipment:   Intra-op Plan:   Post-operative Plan:   Informed Consent: I have reviewed the patients History and Physical, chart, labs and discussed the procedure including the risks, benefits and alternatives for the proposed anesthesia with the patient or authorized representative who has indicated his/her understanding and acceptance.     Dental Advisory Given  Plan Discussed with: Anesthesiologist, CRNA and Surgeon  Anesthesia Plan Comments: (Patient consented for risks of anesthesia including but not limited to:  - adverse reactions to medications - risk of airway placement if required - damage to eyes, teeth, lips or other oral mucosa - nerve damage due to positioning  - sore throat or hoarseness - Damage to heart, brain, nerves, lungs, other parts of body or loss of life  Patient voiced understanding.)        Anesthesia Quick Evaluation

## 2021-07-08 NOTE — Op Note (Signed)
[facility]astro[facility]ology Patient Name: Christina Weber Procedure Date: 07/08/2021 9:21 AM MRN: [id number] Account #: [id number] Date of Birth: 1975-04-21 Admit Type: Outpatient Age: 46 Room: [facility] ENDO ROOM 2 Gender: Female Note Status: Finalized Instrument Name: Park Meo [id number] Procedure:             Colonoscopy Indications:           Screening for colorectal malignant neoplasm Providers:             Jonathon Bellows MD, MD Referring MD:          Camillia Herter (Referring MD) Medicines:             Monitored Anesthesia Care Complications:         No immediate complications. Procedure:             Pre-Anesthesia Assessment:                        - Prior to the procedure, a History and Physical was                         performed, and patient medications, allergies and                         sensitivities were reviewed. The patient's tolerance                         of previous anesthesia was reviewed.                        - The risks and benefits of the procedure and the                         sedation options and risks were discussed with the                         patient. All questions were answered and informed                         consent was obtained.                        - ASA Grade Assessment: II - A patient with mild                         systemic disease.                        After obtaining informed consent, the colonoscope was                         passed under direct vision. Throughout the procedure,                         the patient's blood pressure, pulse, and oxygen                         saturations were monitored continuously. The                         Colonoscope was introduced  through the anus and                         advanced to the the cecum, identified by the                         appendiceal orifice. The colonoscopy was performed                         with ease. The patient tolerated the procedure well.                          The quality of the bowel preparation was excellent. Findings:      The perianal and digital rectal examinations were normal.      Two sessile polyps were found in the ascending colon and cecum. The       polyps were 6 to 8 mm in size. These polyps were removed with a cold       snare. Resection and retrieval were complete.      Two sessile polyps were found in the rectum. The polyps were 4 to 6 mm       in size. These polyps were removed with a cold snare. Resection and       retrieval were complete.      The exam was otherwise without abnormality on direct and retroflexion       views. Impression:            - Two 6 to 8 mm polyps in the ascending colon and in                         the cecum, removed with a cold snare. Resected and                         retrieved.                        - Two 4 to 6 mm polyps in the rectum, removed with a                         cold snare. Resected and retrieved.                        - The examination was otherwise normal on direct and                         retroflexion views. Recommendation:        - Discharge patient to home (with escort).                        - Resume previous diet.                        - Continue present medications.                        - Await pathology results.                        - Repeat colonoscopy for surveillance based on  pathology results. Procedure Code(s):     --- Professional ---                        [phone], Colonoscopy, flexible; with removal of                         tumor(s), polyp(s), or other lesion(s) by snare                         technique Diagnosis Code(s):     --- Professional ---                        Z12.11, Encounter for screening for malignant neoplasm                         of colon                        K63.5, Polyp of colon                        K62.1, Rectal polyp CPT copyright 2019 [employer]. All rights  reserved. The codes documented in this report are preliminary and upon coder review may  be revised to meet current compliance requirements. Jonathon Bellows, MD Jonathon Bellows MD, MD 07/08/2021 10:02:41 AM This report has been signed electronically. Number of Addenda: 0 Note Initiated On: 07/08/2021 9:21 AM Scope Withdrawal Time: 0 hours 11 minutes 25 seconds  Total Procedure Duration: 0 hours 18 minutes 3 seconds  Estimated Blood Loss:  Estimated blood loss: none.      [facility]

## 2021-07-08 NOTE — Transfer of Care (Signed)
Immediate Anesthesia Transfer of Care Note  Patient: Christina Weber  Procedure(s) Performed: COLONOSCOPY WITH PROPOFOL  Patient Location: Endoscopy Unit  Anesthesia Type:General  Level of Consciousness: drowsy  Airway & Oxygen Therapy: Patient Spontanous Breathing  Post-op Assessment: Report given to RN and Post -op Vital signs reviewed and stable  Post vital signs: Reviewed and stable  Last Vitals:  Vitals Value Taken Time  BP 94/61 07/08/21 1005  Temp 36.2 C 07/08/21 1004  Pulse 81 07/08/21 1006  Resp 17 07/08/21 1006  SpO2 100 % 07/08/21 1006  Vitals shown include unvalidated device data.  Last Pain:  Vitals:   07/08/21 1004  TempSrc: Temporal  PainSc: 0-No pain         Complications: No notable events documented.

## 2021-07-08 NOTE — Anesthesia Postprocedure Evaluation (Signed)
Anesthesia Post Note  Patient: Monserrat Vidaurri Schimming  Procedure(s) Performed: COLONOSCOPY WITH PROPOFOL  Patient location during evaluation: Endoscopy Anesthesia Type: General Level of consciousness: awake and alert Pain management: pain level controlled Vital Signs Assessment: post-procedure vital signs reviewed and stable Respiratory status: spontaneous breathing, nonlabored ventilation, respiratory function stable and patient connected to nasal cannula oxygen Cardiovascular status: blood pressure returned to baseline and stable Postop Assessment: no apparent nausea or vomiting Anesthetic complications: no   No notable events documented.   Last Vitals:  Vitals:   07/08/21 1014 07/08/21 1024  BP: 97/76 108/72  Pulse: 68 60  Resp: 15 (!) 22  Temp:    SpO2: 100% 100%    Last Pain:  Vitals:   07/08/21 1024  TempSrc:   PainSc: 0-No pain                 Precious Haws Zadok Holaway

## 2021-07-08 NOTE — H&P (Signed)
Jonathon Bellows, MD [address] [place], [place], [place], [address] [address], [place], [place], [place], [address] Phone: [phone]  Fax: [phone]  Primary Care Physician:  Camillia Herter, NP   Pre-Procedure History & Physical: HPI:  Christina Weber is a 46 y.o. female is here for an colonoscopy.   Past Medical History:  Diagnosis Date   Abnormal Pap smear    Breast abscess    right breast   Complication of anesthesia    ? states caused umbilical area to harden   HPV in female 2010   Kidney stone    pyelo   Psoriasis     Past Surgical History:  Procedure Laterality Date   BREAST CYST ASPIRATION     COLPOSCOPY  01/17/2020       DILATION AND CURETTAGE OF UTERUS     DILATION AND EVACUATION N/A 04/05/2014   Procedure: DILATATION AND EVACUATION;  Surgeon: Darlyn Chamber, MD;  Location: [facility];  Service: Gynecology;  Laterality: N/A;   LAPAROSCOPY  03/2001   LEEP  2010    Prior to Admission medications   Medication Sig Start Date End Date Taking? Authorizing Provider  AZO-CRANBERRY PO Take by mouth daily.   Yes [provider]  ibuprofen (ADVIL) 800 MG tablet Take 1 tablet (800 mg total) by mouth every 8 (eight) hours as needed (pain). 07/05/21  Yes Barrett Henle, MD  metoprolol succinate (TOPROL-XL) 50 MG 24 hr tablet Take 0.5 tablets (25 mg total) by mouth daily. 06/25/21 10/23/21 Yes Camillia Herter, NP    Allergies as of 06/26/2021 - Review Complete 06/26/2021  Allergen Reaction Noted   Latex Swelling, Rash, and Other (See Comments) 01/26/2013    Family History  Problem Relation Age of Onset   Cancer Mother        brain   Hypertension Father    Cancer Father        lymph nodes   Diabetes Paternal Grandmother    Hypertension Paternal Grandmother    Hearing loss Neg Hx     Social History   Socioeconomic History   Marital status: Single    Spouse name: Not on file   Number of children: Not on file   Years of education: Not  on file   Highest education level: Bachelor's degree (e.g., BA, AB, BS)  Occupational History   Not on file  Tobacco Use   Smoking status: Former    Types: Cigarettes    Quit date: 05/25/2012    Years since quitting: 9.1    Passive exposure: Past   Smokeless tobacco: Never  Vaping Use   Vaping Use: Never used  Substance and Sexual Activity   Alcohol use: Yes    Comment: occasionally on weekends   Drug use: No   Sexual activity: Yes    Birth control/protection: None  Other Topics Concern   Not on file  Social History Narrative   Not on file   Social Determinants of Health   Financial Resource Strain: Not on file  Food Insecurity: No Food Insecurity   Worried About Running Out of Food in the Last Year: Never true   Ran Out of Food in the Last Year: Never true  Transportation Needs: No Transportation Needs   Lack of Transportation (Medical): No   Lack of Transportation (Non-Medical): No  Physical Activity: Not on file  Stress: Not on file  Social Connections: Not on file  Intimate Partner Violence: Not  on file    Review of Systems: See HPI, otherwise negative ROS  Physical Exam: BP 111/80   Pulse 69   Temp (!) 96.3 F (35.7 C) (Temporal)   Resp 20   Ht 5' 2.5" (1.588 m)   Wt 68.9 kg   SpO2 100%   BMI 27.36 kg/m  General:   Alert,  pleasant and cooperative in NAD Head:  Normocephalic and atraumatic. Neck:  Supple; no masses or thyromegaly. Lungs:  Clear throughout to auscultation, normal respiratory effort.    Heart:  +S1, +S2, Regular rate and rhythm, No edema. Abdomen:  Soft, nontender and nondistended. Normal bowel sounds, without guarding, and without rebound.   Neurologic:  Alert and  oriented x4;  grossly normal neurologically.  Impression/Plan: Christina Weber is here for an colonoscopy to be performed for Screening colonoscopy average risk   Risks, benefits, limitations, and alternatives regarding  colonoscopy have been reviewed with the patient.   Questions have been answered.  All parties agreeable.   Jonathon Bellows, MD  07/08/2021, 9:24 AM

## 2021-07-09 ENCOUNTER — Encounter: Payer: Self-pay | Admitting: Gastroenterology

## 2021-07-09 LAB — SURGICAL PATHOLOGY

## 2021-07-13 ENCOUNTER — Encounter: Payer: Self-pay | Admitting: Gastroenterology

## 2021-11-14 ENCOUNTER — Other Ambulatory Visit: Payer: Self-pay | Admitting: Family

## 2021-11-14 DIAGNOSIS — Z1231 Encounter for screening mammogram for malignant neoplasm of breast: Secondary | ICD-10-CM

## 2021-12-12 ENCOUNTER — Ambulatory Visit
Admission: RE | Admit: 2021-12-12 | Discharge: 2021-12-12 | Disposition: A | Discharge status: Home / Self Care | Payer: BLUE CROSS/BLUE SHIELD | Source: Ambulatory Visit

## 2021-12-12 DIAGNOSIS — Z1231 Encounter for screening mammogram for malignant neoplasm of breast: Secondary | ICD-10-CM

## 2022-04-03 ENCOUNTER — Encounter: Payer: Self-pay | Admitting: Certified Nurse Midwife

## 2022-04-03 ENCOUNTER — Ambulatory Visit (INDEPENDENT_AMBULATORY_CARE_PROVIDER_SITE_OTHER): Payer: BLUE CROSS/BLUE SHIELD | Admitting: Certified Nurse Midwife

## 2022-04-03 ENCOUNTER — Other Ambulatory Visit (HOSPITAL_COMMUNITY)
Admission: RE | Admit: 2022-04-03 | Discharge: 2022-04-03 | Disposition: A | Discharge status: Home / Self Care | Payer: BLUE CROSS/BLUE SHIELD | Source: Ambulatory Visit | Attending: Obstetrics and Gynecology | Admitting: Obstetrics and Gynecology

## 2022-04-03 VITALS — BP 109/79 | HR 92 | Ht 62.5 in | Wt 137.0 lb

## 2022-04-03 DIAGNOSIS — Z01419 Encounter for gynecological examination (general) (routine) without abnormal findings: Secondary | ICD-10-CM | POA: Diagnosis not present

## 2022-04-03 DIAGNOSIS — Z113 Encounter for screening for infections with a predominantly sexual mode of transmission: Secondary | ICD-10-CM | POA: Diagnosis not present

## 2022-04-04 LAB — HEPATITIS C ANTIBODY: Hep C Virus Ab: NONREACTIVE

## 2022-04-04 LAB — RPR: RPR Ser Ql: NONREACTIVE

## 2022-04-04 LAB — HIV ANTIBODY (ROUTINE TESTING W REFLEX): HIV Screen 4th Generation wRfx: NONREACTIVE

## 2022-04-04 LAB — HEPATITIS B SURFACE ANTIGEN: Hepatitis B Surface Ag: NEGATIVE

## 2022-04-05 NOTE — Progress Notes (Signed)
GYNECOLOGY ANNUAL PREVENTATIVE CARE ENCOUNTER NOTE  History:     DALAYAH STALLING is a 47 y.o. [phone] female here for a routine annual gynecologic exam.  Current complaints: None.   Denies abnormal vaginal bleeding, discharge, pelvic pain, problems with intercourse or other gynecologic concerns.    Gynecologic History Patient's last menstrual period was 10/07/2021. Contraception: none Last Pap: 02/27/21. Result was normal with negative HPV Last Mammogram: 12/2021.  Result was normal Last Colonoscopy: 07/09/22.  Result was normal  Obstetric History OB History  Gravida Para Term Preterm AB Living  '7 4 2 2 3 4  '$ SAB IAB Ectopic Multiple Live Births    '1 2   4    '$ # Outcome Date GA Lbr Len/2nd Weight Sex Delivery Anes PTL Lv  7 Ectopic 09/2016          6 Term 01/26/13 [id number] / 00:14 6 lb 10.5 oz (3.02 kg) F Vag-Spont EPI  LIV  5 IAB 11/20/05             Birth Comments: Baby had heart problems at early gestation  4 Preterm 02/16/97    F Vag-Spont   LIV  3 Term 10/08/95    F Vag-Spont   LIV  2 Preterm 01/09/94    M Vag-Spont   LIV  1 Ectopic 09/16/91            Past Medical History:  Diagnosis Date   Abnormal Pap smear    Breast abscess    right breast   Complication of anesthesia    ? states caused umbilical area to harden   HPV in female 2010   Hypertension    Kidney stone    pyelo   Psoriasis     Past Surgical History:  Procedure Laterality Date   BREAST CYST ASPIRATION     COLONOSCOPY WITH PROPOFOL N/A 07/08/2021   Procedure: COLONOSCOPY WITH PROPOFOL;  Surgeon: AJonathon Bellows MD;  [facility];  Service: Gastroenterology;  Laterality: N/A;   COLPOSCOPY  01/17/2020       DILATION AND CURETTAGE OF UTERUS     DILATION AND EVACUATION N/A 04/05/2014   Procedure: DILATATION AND EVACUATION;  Surgeon: JDarlyn Chamber MD;  [facility];  Service: Gynecology;  Laterality: N/A;   LAPAROSCOPY  03/2001   LEEP  2010    Current Outpatient Medications on  File Prior to Visit  Medication Sig Dispense Refill   AZO-CRANBERRY PO Take by mouth daily.     ibuprofen (ADVIL) 800 MG tablet Take 1 tablet (800 mg total) by mouth every 8 (eight) hours as needed (pain). 21 tablet 0   metoprolol succinate (TOPROL-XL) 50 MG 24 hr tablet Take 0.5 tablets (25 mg total) by mouth daily. 15 tablet 3   No current facility-administered medications on file prior to visit.    Allergies  Allergen Reactions   Latex Swelling, Rash and Other (See Comments)    Reaction: Localized swelling    Social History:  reports that she quit smoking about 9 years ago. Her smoking use included cigarettes. She has been exposed to tobacco smoke. She has never used smokeless tobacco. She reports current alcohol use. She reports that she does not use drugs.  Family History  Problem Relation Age of Onset   Cancer Mother        brain   Hypertension Father    Cancer Father        lymph nodes   Diabetes Paternal Grandmother  Hypertension Paternal Grandmother    Hearing loss Neg Hx     The following portions of the patient's history were reviewed and updated as appropriate: allergies, current medications, past family history, past medical history, past social history, past surgical history and problem list.  Review of Systems Pertinent items noted in HPI and remainder of comprehensive ROS otherwise negative.  Physical Exam:  BP 109/79   Pulse 92   Ht 5' 2.5" (1.588 m)   Wt 137 lb (62.1 kg)   LMP 10/07/2021   BMI 24.66 kg/m  CONSTITUTIONAL: Well-developed, well-nourished female in no acute distress.  HENT:  Normocephalic, atraumatic, External right and left ear normal.  EYES: Conjunctivae and EOM are normal. Pupils are equal, round, and reactive to light. No scleral icterus.  NECK: Normal range of motion, supple, no masses.  Normal thyroid.  SKIN: Skin is warm and dry. No rash noted. Not diaphoretic. No erythema. No pallor. MUSCULOSKELETAL: Normal range of motion. No  tenderness.  No cyanosis, clubbing, or edema. NEUROLOGIC: Alert and oriented to person, place, and time. Normal reflexes, muscle tone coordination.  PSYCHIATRIC: Normal mood and affect. Normal behavior. Normal judgment and thought content. CARDIOVASCULAR: Normal heart rate noted, regular rhythm RESPIRATORY: Clear to auscultation bilaterally. Effort and breath sounds normal, no problems with respiration noted. BREASTS: Symmetric in size. No masses, tenderness, skin changes, nipple drainage, or lymphadenopathy bilaterally. Performed in the presence of a chaperone. ABDOMEN: Soft, no distention noted.  No tenderness, rebound or guarding.  PELVIC: Normal appearing external genitalia and urethral meatus; normal appearing vaginal mucosa and cervix.  No abnormal vaginal discharge noted.  Pap smear obtained.  Normal uterine size, no other palpable masses, no uterine or adnexal tenderness.  Performed in the presence of a chaperone.   Assessment and Plan:    1. Encounter for annual routine gynecological examination - Patient doing well. 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 She reports that her youngest daughter is Autistic and but completing this she can do more to help her.     2. Screening examination for STD (sexually transmitted disease) - Patient reports she is not in a sexual relationship with someone of the opposite sex. She still request STD screening today. Labs ordered.  - Cytology - PAP( [place]) - HIV antibody (with reflex) - RPR - Hepatitis B Surface AntiGEN - Hepatitis C Antibody  Will follow up results of pap smear and manage accordingly. Mammogram scheduled Colon cancer screening is up to date. Discussed need for colon cancer screening over the age 46. Colonoscopy recommended as this is the gold standard. Routine preventative health maintenance measures emphasized. Please refer to After Visit Summary for other counseling  recommendations.     Patient mat return to Clinic as needed otherwise in 1 year for annual follow up.   Siddhi Dornbush Isaias Sakai) Rollene Rotunda, MSN, [place] for [place]  04/05/22 10:17 AM

## 2022-04-08 LAB — CYTOLOGY - PAP
Chlamydia: NEGATIVE
Comment: NEGATIVE
Comment: NEGATIVE
Comment: NEGATIVE
Comment: NEGATIVE
Comment: NEGATIVE
Comment: NORMAL
Diagnosis: UNDETERMINED — AB
HPV 16: NEGATIVE
HPV 18 / 45: POSITIVE — AB
High risk HPV: POSITIVE — AB
Neisseria Gonorrhea: NEGATIVE
Trichomonas: NEGATIVE

## 2022-05-14 ENCOUNTER — Other Ambulatory Visit (HOSPITAL_COMMUNITY)
Admission: RE | Admit: 2022-05-14 | Discharge: 2022-05-14 | Disposition: A | Discharge status: Home / Self Care | Payer: BLUE CROSS/BLUE SHIELD | Source: Ambulatory Visit | Attending: Obstetrics and Gynecology | Admitting: Obstetrics and Gynecology

## 2022-05-14 ENCOUNTER — Other Ambulatory Visit: Payer: Self-pay

## 2022-05-14 ENCOUNTER — Ambulatory Visit (INDEPENDENT_AMBULATORY_CARE_PROVIDER_SITE_OTHER): Payer: BLUE CROSS/BLUE SHIELD | Admitting: Obstetrics and Gynecology

## 2022-05-14 VITALS — BP 122/89 | HR 72

## 2022-05-14 DIAGNOSIS — N879 Dysplasia of cervix uteri, unspecified: Secondary | ICD-10-CM | POA: Insufficient documentation

## 2022-05-14 LAB — POCT PREGNANCY, URINE: Preg Test, Ur: NEGATIVE

## 2022-05-14 NOTE — Progress Notes (Signed)
    GYNECOLOGY OFFICE COLPOSCOPY PROCEDURE NOTE  47 y.o. [id number] here for colposcopy for ASCUS with POSITIVE high risk HPV pap smear on 04/03/2022. Discussed role for HPV in cervical dysplasia, need for surveillance.  Patient gave informed written consent, time out was performed.  Placed in lithotomy position. Cervix viewed with speculum and colposcope after application of acetic acid.   Colposcopy adequate? Yes  no mosaicism, no punctation, and no abnormal vasculature; non-lugols uptake at 12 oclock, slight red area of endocervix at 12 oclock without abnormalities corresponding biopsies obtained.  ECC specimen obtained. All specimens were labeled and sent to pathology.  Chaperone was present during entire procedure.  Patient was given post procedure instructions.  Will follow up pathology and manage accordingly; patient will be contacted with results and recommendations.  Routine preventative health maintenance measures emphasized.    Lorriane Shire, MD Obstetrician & Gynecologist, [facility] for [employer], [facility]

## 2022-05-18 LAB — SURGICAL PATHOLOGY

## 2022-05-20 ENCOUNTER — Encounter: Payer: Self-pay | Admitting: Obstetrics and Gynecology

## 2022-10-21 ENCOUNTER — Telehealth: Payer: Self-pay

## 2022-10-21 NOTE — Telephone Encounter (Signed)
  Medicaid Managed Care   Unsuccessful Outreach Note  10/21/2022 Name: Christina Weber MRN: [id number] DOB: 1975/10/04  Referred by: Rema Fendt, NP Reason for referral : No chief complaint on file.   An unsuccessful telephone outreach was attempted today. The patient was referred to the case management team for assistance with care management and care coordination.   Follow Up Plan: If patient returns call to provider office, please advise to call [employer]ed[employer] Care Management Care Guide Nicholes Rough* at [phone]*  Nicholes Rough, CMA Care Guide [employer]

## 2023-01-03 DIAGNOSIS — H5213 Myopia, bilateral: Secondary | ICD-10-CM | POA: Diagnosis not present

## 2023-01-04 ENCOUNTER — Telehealth: Payer: Self-pay | Admitting: Family

## 2023-01-04 NOTE — Telephone Encounter (Signed)
Called pt and left vm to call office back to schedule follow up appt requested via mychart. Pt is due for physical

## 2023-01-05 ENCOUNTER — Other Ambulatory Visit: Payer: Self-pay | Admitting: Family

## 2023-01-05 DIAGNOSIS — Z1231 Encounter for screening mammogram for malignant neoplasm of breast: Secondary | ICD-10-CM

## 2023-02-05 ENCOUNTER — Ambulatory Visit
Admission: RE | Admit: 2023-02-05 | Discharge: 2023-02-05 | Disposition: A | Discharge status: Home / Self Care | Payer: Managed Care, Other (non HMO) | Source: Ambulatory Visit

## 2023-02-05 DIAGNOSIS — Z1231 Encounter for screening mammogram for malignant neoplasm of breast: Secondary | ICD-10-CM

## 2023-02-18 ENCOUNTER — Telehealth: Payer: Self-pay | Admitting: Family

## 2023-02-18 ENCOUNTER — Telehealth: Payer: Self-pay

## 2023-02-18 NOTE — Telephone Encounter (Signed)
 Pt given mammogram results per notes of Amy on 02/12/23. Pt verbalized understanding.   Rema Fendt, NP 02/12/2023  8:05 AM EST     No mammographic evidence of malignancy. Screening mammogram in one year.

## 2023-02-18 NOTE — Telephone Encounter (Signed)
 Called patient again, and left voicemail to call practice back.

## 2023-02-18 NOTE — Telephone Encounter (Signed)
 Copied from CRM [id number]. Topic: General - Call Back - No Documentation >> Feb 17, 2023  5:50 PM Christina Weber wrote: Reason for CRM: The patient has returned a missed call fro the practice  Please contact the patient further when available

## 2023-04-30 ENCOUNTER — Ambulatory Visit (HOSPITAL_COMMUNITY)
Admission: EM | Admit: 2023-04-30 | Discharge: 2023-04-30 | Disposition: A | Discharge status: Home / Self Care | Attending: Family Medicine | Admitting: Family Medicine

## 2023-04-30 ENCOUNTER — Encounter (HOSPITAL_COMMUNITY): Payer: Self-pay

## 2023-04-30 DIAGNOSIS — Z202 Contact with and (suspected) exposure to infections with a predominantly sexual mode of transmission: Secondary | ICD-10-CM | POA: Diagnosis not present

## 2023-04-30 LAB — HIV ANTIBODY (ROUTINE TESTING W REFLEX): HIV Screen 4th Generation wRfx: NONREACTIVE

## 2023-04-30 NOTE — ED Provider Notes (Signed)
 [facility]  Note:  This document was prepared using Dragon voice recognition software and may include unintentional dictation errors.  MRN: [id number] DOB: Aug 13, 1975  Subjective:   Christina Weber is a 48 y.o. female presenting for STD exposure testing following exposure to her current boyfriend who tested positive for HSV yesterday.  Patient reports that she has had a history of oral cold sores in the past but began having blister to her top lip after finding out he was positive for HSV.  Patient would like testing for HIV, RPR syphilis, vaginal swab for gonorrhea/chlamydia, trichomonas, BV, trichomonas, vaginal yeast, as well as HSV PCR swab.  Patient denies any other symptoms other than cold sore on her lip.  Patient denies vaginal lesions, vaginal discharge, pelvic pain, dysuria, flank pain.  No current facility-administered medications for this encounter.  Current Outpatient Medications:    AZO-CRANBERRY PO, Take by mouth daily., Disp: , Rfl:    ibuprofen (ADVIL) 800 MG tablet, Take 1 tablet (800 mg total) by mouth every 8 (eight) hours as needed (pain)., Disp: 21 tablet, Rfl: 0   metoprolol succinate (TOPROL-XL) 50 MG 24 hr tablet, Take 0.5 tablets (25 mg total) by mouth daily., Disp: 15 tablet, Rfl: 3   Allergies  Allergen Reactions   Latex Swelling, Rash and Other (See Comments)    Reaction: Localized swelling    Past Medical History:  Diagnosis Date   Abnormal Pap smear    Breast abscess    right breast   Complication of anesthesia    ? states caused umbilical area to harden   HPV in female 2010   Hypertension    Kidney stone    pyelo   Psoriasis      Past Surgical History:  Procedure Laterality Date   BREAST CYST ASPIRATION     COLONOSCOPY WITH PROPOFOL N/A 07/08/2021   Procedure: COLONOSCOPY WITH PROPOFOL;  Surgeon: Wyline Mood, MD;  Location: [facility];  Service: Gastroenterology;  Laterality: N/A;   COLPOSCOPY  01/17/2020       DILATION  AND CURETTAGE OF UTERUS     DILATION AND EVACUATION N/A 04/05/2014   Procedure: DILATATION AND EVACUATION;  Surgeon: Juluis Mire, MD;  Location: [facility];  Service: Gynecology;  Laterality: N/A;   LAPAROSCOPY  03/2001   LEEP  2010    Family History  Problem Relation Age of Onset   Cancer Mother        brain   Hypertension Father    Cancer Father        lymph nodes   Diabetes Paternal Grandmother    Hypertension Paternal Grandmother    Hearing loss Neg Hx     Social History   Tobacco Use   Smoking status: Former    Current packs/day: 0.00    Types: Cigarettes    Quit date: 05/25/2012    Years since quitting: 10.9    Passive exposure: Past   Smokeless tobacco: Never  Vaping Use   Vaping status: Never Used  Substance Use Topics   Alcohol use: Yes    Comment: occasionally on weekends   Drug use: No    ROS Refer to HPI for ROS details.  Objective:   Vitals: BP 128/83 (BP Location: Left Arm)   Pulse 70   Temp 98.7 F (37.1 C) (Oral)   Resp 16   Ht 5' 2.5" (1.588 m)   Wt 117 lb (53.1 kg)   SpO2 98%   BMI 21.06 kg/m   Physical  Exam Vitals and nursing note reviewed.  Constitutional:      General: She is not in acute distress.    Appearance: Normal appearance. She is well-developed. She is not ill-appearing or toxic-appearing.  HENT:     Head: Normocephalic.     Mouth/Throat:     Mouth: Mucous membranes are moist. Oral lesions (Blisterlike lesion noted to upper lip, presentation consistent with possible HSV oral lesion.) present.     Tongue: No lesions.     Pharynx: Oropharynx is clear. No oropharyngeal exudate or posterior oropharyngeal erythema.  Cardiovascular:     Rate and Rhythm: Normal rate.  Pulmonary:     Effort: Pulmonary effort is normal. No respiratory distress.  Skin:    General: Skin is warm and dry.  Neurological:     General: No focal deficit present.     Mental Status: She is alert and oriented to person, place, and time.  Psychiatric:         Mood and Affect: Mood normal.     Procedures  No results found for this or any previous visit (from the past 24 hours).  Assessment and Plan :   PDMP not reviewed this encounter.  1. Possible exposure to STD    1. Possible exposure to STD (Primary) - HSV 1/2 PCR (Surface) swab collected and sent to lab for further testing, results should be available in 24 to 48 hours - Cervicovaginal swab collected by patient and UC, sent to lab for further testing results should be available in 24 to 48 hours - HIV Antibody and RPR serum testing collected in UC sent to lab for further testing results should be available in 24 to 48 hours. -Patient advised to continue monitoring symptoms for any change in severity if there is any escalation of current symptoms or development of new symptoms follow-up for further evaluation and management.  [name]   [place], [place], [place] 04/30/23 1621

## 2023-04-30 NOTE — Discharge Instructions (Addendum)
 1. Possible exposure to STD (Primary) - HSV 1/2 PCR (Surface) swab collected and sent to lab for further testing, results should be available in 24 to 48 hours - Cervicovaginal swab collected by patient and UC, sent to lab for further testing results should be available in 24 to 48 hours - HIV Antibody and RPR serum testing collected in UC sent to lab for further testing results should be available in 24 to 48 hours. -Patient advised to continue monitoring symptoms for any change in severity if there is any escalation of current symptoms or development of new symptoms follow-up for further evaluation and management.

## 2023-04-30 NOTE — ED Triage Notes (Signed)
 Patient presenting with an outbreak of fever blisters onset this morning. No other symptoms or known exposures. Wanting full STD testing.   Prescriptions or OTC medications tried: Yes- otc cold sore meds    with little relief.

## 2023-05-01 LAB — RPR: RPR Ser Ql: NONREACTIVE

## 2023-05-01 LAB — HSV 1/2 PCR (SURFACE)
HSV-1 DNA: NOT DETECTED
HSV-2 DNA: NOT DETECTED

## 2023-05-03 ENCOUNTER — Encounter (HOSPITAL_COMMUNITY): Payer: Self-pay

## 2023-05-03 LAB — CERVICOVAGINAL ANCILLARY ONLY
Bacterial Vaginitis (gardnerella): POSITIVE — AB
Candida Glabrata: NEGATIVE
Candida Vaginitis: NEGATIVE
Chlamydia: NEGATIVE
Comment: NEGATIVE
Comment: NEGATIVE
Comment: NEGATIVE
Comment: NEGATIVE
Comment: NEGATIVE
Comment: NORMAL
Neisseria Gonorrhea: NEGATIVE
Trichomonas: NEGATIVE

## 2023-05-06 IMAGING — MG MM DIGITAL SCREENING BILAT W/ TOMO AND CAD
8 series · 8 of 24 positions shown · non-contrast
Comparison: Previous exam(s).

CLINICAL DATA: Screening.

EXAM:
DIGITAL SCREENING BILATERAL MAMMOGRAM WITH TOMOSYNTHESIS AND CAD
TECHNIQUE: Bilateral screening digital craniocaudal and mediolateral oblique
mammograms were obtained. Bilateral screening digital breast
tomosynthesis was performed. The images were evaluated with
computer-aided detection.

[R CC synth-2D]
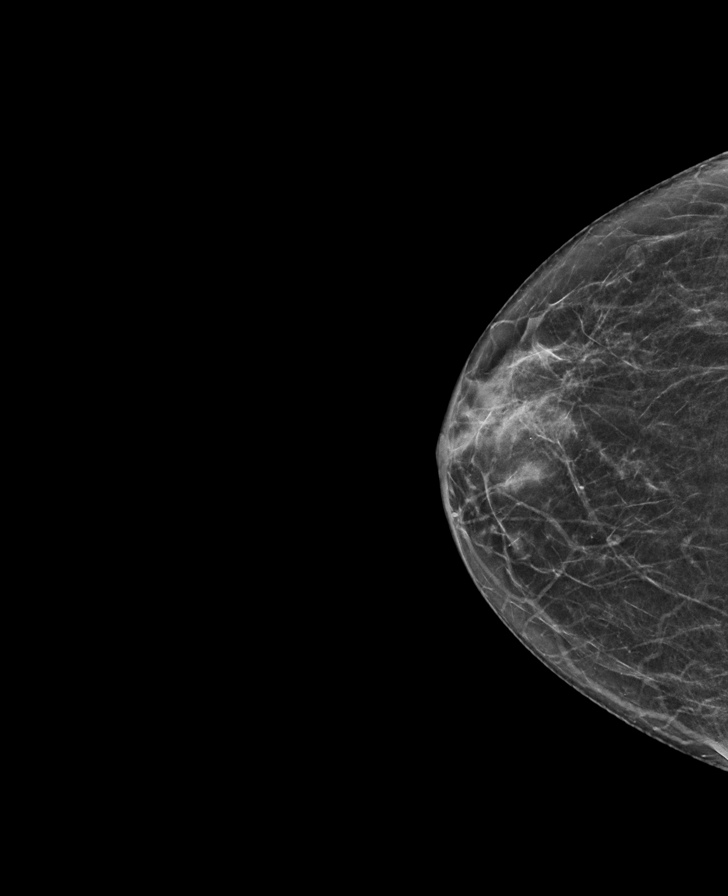

[L MLO synth-2D]
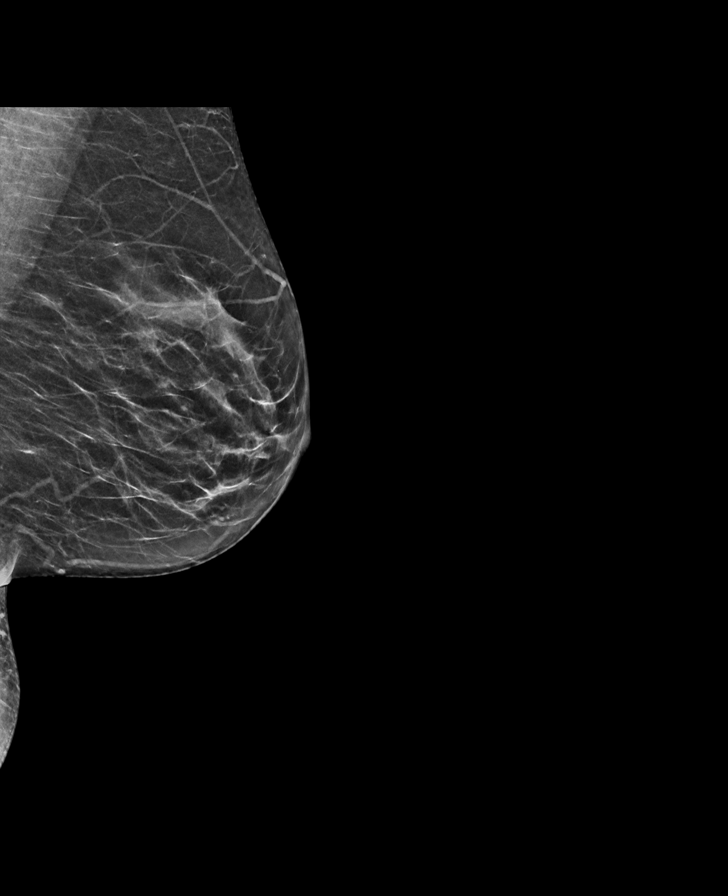

[L CC synth-2D]
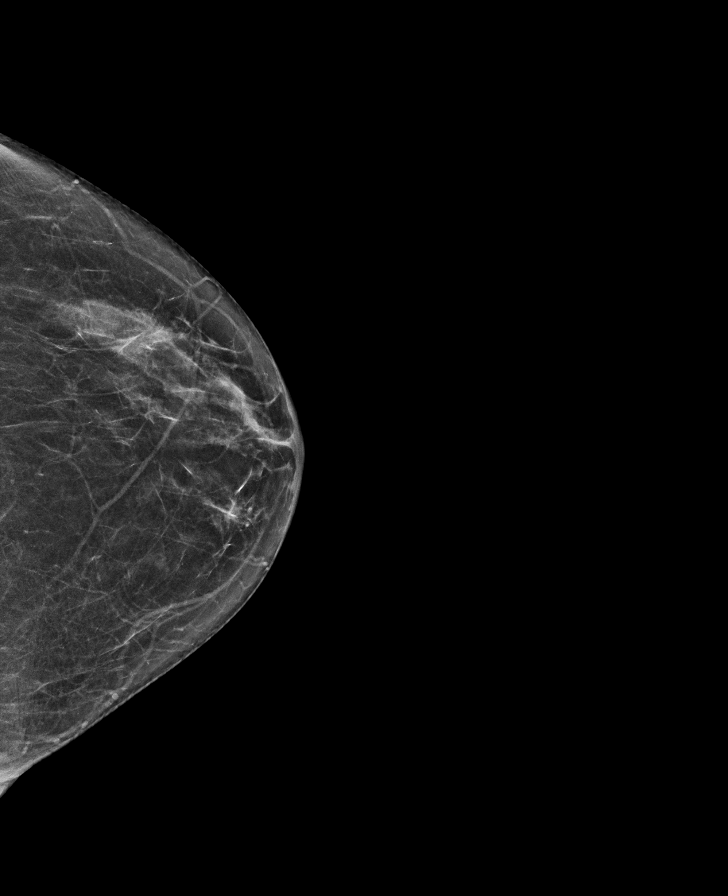

[R MLO synth-2D]
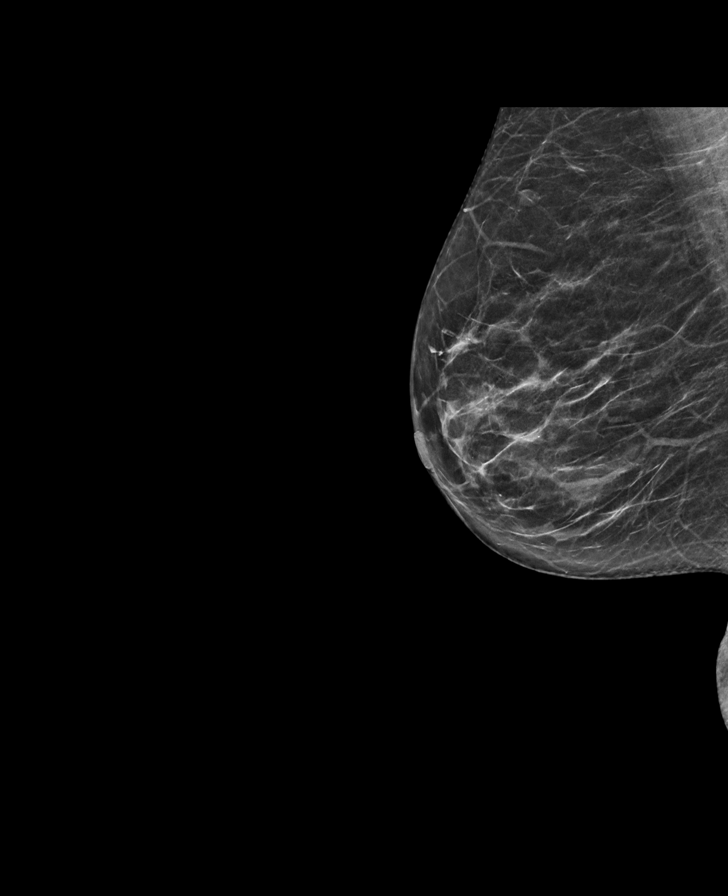

[L CC tomo · tomo slice 29/56.0]
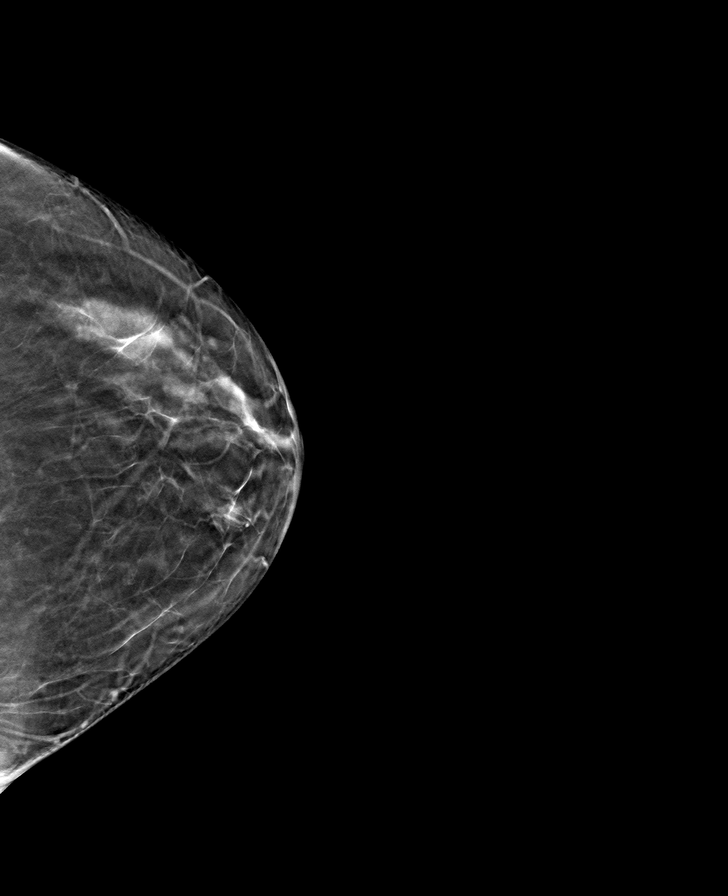

[L MLO tomo · tomo slice 29/57.0]
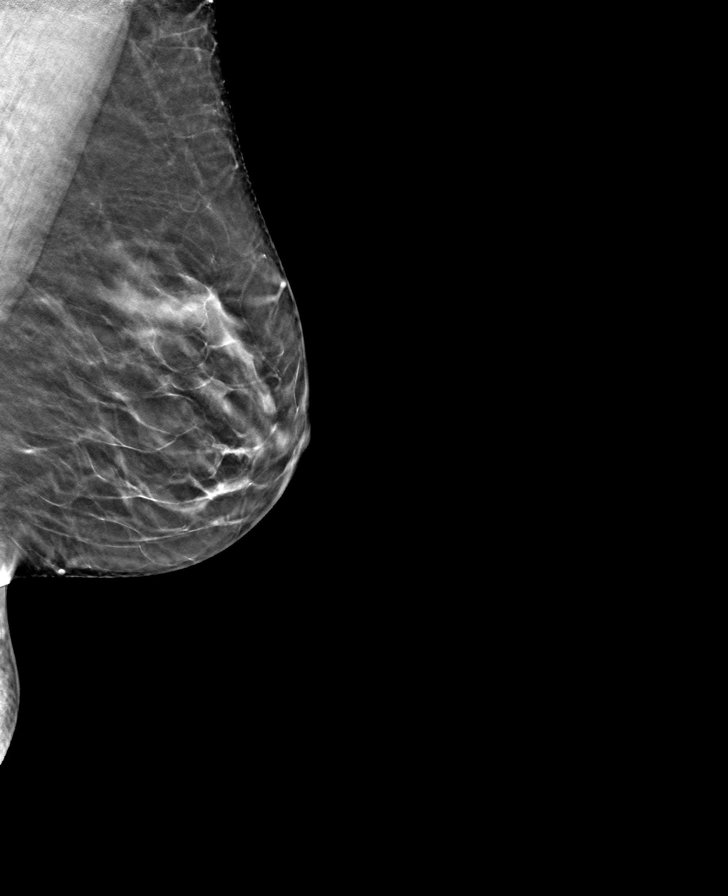

[R MLO tomo · tomo slice 29/58.0]
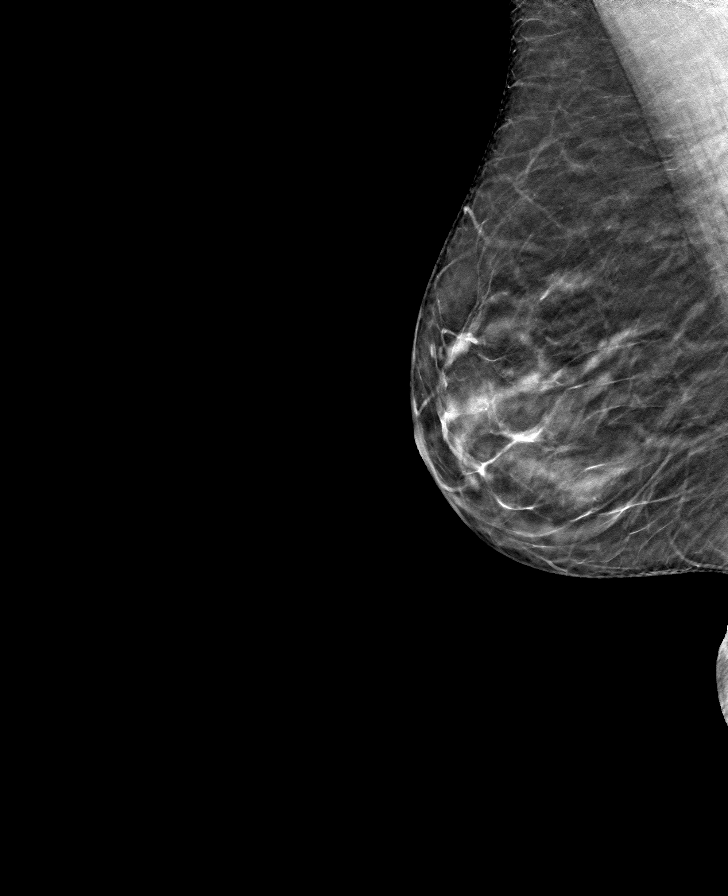

[R CC tomo · tomo slice 28/55.0]
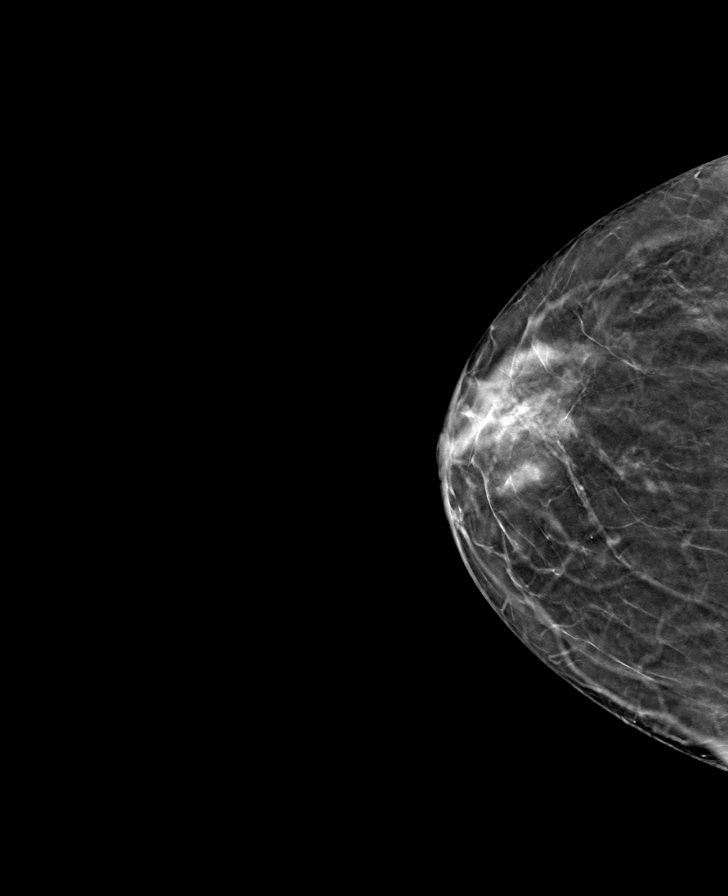

[8 of 24 positions shown; findings below may reference images not displayed]

ACR Breast Density Category b: There are scattered areas of
fibroglandular density.
FINDINGS: There are no findings suspicious for malignancy.
IMPRESSION: No mammographic evidence of malignancy. A result letter of this
screening mammogram will be mailed directly to the patient.

RECOMMENDATION:
Screening mammogram in one year. (Code:51-O-LD2)

BI-RADS CATEGORY  1: Negative.

## 2023-06-10 ENCOUNTER — Other Ambulatory Visit (HOSPITAL_COMMUNITY)
Admission: RE | Admit: 2023-06-10 | Discharge: 2023-06-10 | Disposition: A | Discharge status: Home / Self Care | Source: Ambulatory Visit | Attending: Obstetrics and Gynecology | Admitting: Obstetrics and Gynecology

## 2023-06-10 ENCOUNTER — Ambulatory Visit: Admitting: Obstetrics and Gynecology

## 2023-06-10 ENCOUNTER — Encounter: Payer: Self-pay | Admitting: Obstetrics and Gynecology

## 2023-06-10 VITALS — BP 115/76 | Wt 120.0 lb

## 2023-06-10 DIAGNOSIS — Z124 Encounter for screening for malignant neoplasm of cervix: Secondary | ICD-10-CM | POA: Insufficient documentation

## 2023-06-10 DIAGNOSIS — Z1331 Encounter for screening for depression: Secondary | ICD-10-CM

## 2023-06-10 DIAGNOSIS — N879 Dysplasia of cervix uteri, unspecified: Secondary | ICD-10-CM

## 2023-06-10 NOTE — Progress Notes (Signed)
    GYNECOLOGY VISIT  Patient name: Christina Weber MRN [id number]  Date of birth: [birth year] Chief Complaint:   Gynecologic Exam  History:  Christina Weber is a 48 y.o. [id number] being seen today for repeat pap.    Past Medical History:  Diagnosis Date   Abnormal Pap smear    Breast abscess    right breast   Complication of anesthesia    ? states caused umbilical area to harden   HPV in female 2010   Hypertension    Kidney stone    pyelo   Psoriasis     Past Surgical History:  Procedure Laterality Date   BREAST CYST ASPIRATION     COLONOSCOPY WITH PROPOFOL  N/A 07/08/2021   Procedure: COLONOSCOPY WITH PROPOFOL ;  Surgeon: Luke Salaam, MD;  [facility];  Service: Gastroenterology;  Laterality: N/A;   COLPOSCOPY  01/17/2020       DILATION AND CURETTAGE OF UTERUS     DILATION AND EVACUATION N/A 04/05/2014   Procedure: DILATATION AND EVACUATION;  Surgeon: Chandler Combs, MD;  Location: [facility]S;  Service: Gynecology;  Laterality: N/A;   LAPAROSCOPY  03/2001   LEEP  2010    The following portions of the patient's history were reviewed and updated as appropriate: allergies, current medications, past family history, past medical history, past social history, past surgical history and problem list.   Health Maintenance:   Last pap 03/2022 ASCUS, HPV 18/45 positive FINAL MICROSCOPIC DIAGNOSIS:   A. CERVIX, 12 O'CLOCK, BIOPSY:  - Low-grade squamous intraepithelial lesion (CIN1, low grade dysplasia)   B. ENDOCERVIX, CURETTAGE:  - Scant benign cervical glandular mucosa  - Scant benign inactive endometrium  - Negative for dysplasia or malignancy  Last mammogram: 01/2023 birads 1   Review of Systems:  Pertinent items are noted in HPI. Comprehensive review of systems was otherwise negative.   Objective:  Physical Exam BP 115/76   Wt 120 lb (54.4 kg)   BMI 21.60 kg/m    Physical Exam Vitals and nursing note reviewed. Exam conducted with a chaperone present.   Constitutional:      Appearance: Normal appearance.  HENT:     Head: Normocephalic and atraumatic.  Pulmonary:     Effort: Pulmonary effort is normal.     Breath sounds: Normal breath sounds.  Genitourinary:    General: Normal vulva.     Exam position: Lithotomy position.     Vagina: Normal.     Cervix: Normal.  Skin:    General: Skin is warm and dry.  Neurological:     General: No focal deficit present.     Mental Status: She is alert.  Psychiatric:        Mood and Affect: Mood normal.        Behavior: Behavior normal.        Thought Content: Thought content normal.        Judgment: Judgment normal.          Assessment & Plan:   1. Cervical dysplasia (Primary) 2. Screening for cervical cancer CIN 1 on colposcopy, now status post uncomplicated Pap smear collection.  Will follow-up results and proceed per ASCCP guidelines. - Cytology - PAP   Routine preventative health maintenance measures emphasized.  Kiki Pelton, MD [facility] for [facility], [facility]

## 2023-06-18 LAB — CYTOLOGY - PAP
Comment: NEGATIVE
Diagnosis: NEGATIVE
High risk HPV: NEGATIVE

## 2023-06-21 ENCOUNTER — Encounter: Payer: Self-pay | Admitting: Obstetrics and Gynecology

## 2023-11-18 ENCOUNTER — Emergency Department (HOSPITAL_COMMUNITY): Payer: Worker's Compensation

## 2023-11-18 ENCOUNTER — Emergency Department (HOSPITAL_COMMUNITY)
Admission: EM | Admit: 2023-11-18 | Discharge: 2023-11-18 | Disposition: A | Discharge status: Home / Self Care | Payer: Worker's Compensation | Attending: Emergency Medicine | Admitting: Emergency Medicine

## 2023-11-18 ENCOUNTER — Other Ambulatory Visit: Payer: Self-pay

## 2023-11-18 ENCOUNTER — Encounter (HOSPITAL_COMMUNITY): Payer: Self-pay | Admitting: *Deleted

## 2023-11-18 DIAGNOSIS — Z91040 Latex allergy status: Secondary | ICD-10-CM | POA: Diagnosis not present

## 2023-11-18 DIAGNOSIS — I10 Essential (primary) hypertension: Secondary | ICD-10-CM | POA: Insufficient documentation

## 2023-11-18 DIAGNOSIS — F0781 Postconcussional syndrome: Secondary | ICD-10-CM | POA: Insufficient documentation

## 2023-11-18 DIAGNOSIS — W1839XA Other fall on same level, initial encounter: Secondary | ICD-10-CM | POA: Diagnosis not present

## 2023-11-18 DIAGNOSIS — Z79899 Other long term (current) drug therapy: Secondary | ICD-10-CM | POA: Insufficient documentation

## 2023-11-18 DIAGNOSIS — R519 Headache, unspecified: Secondary | ICD-10-CM | POA: Insufficient documentation

## 2023-11-18 MED ORDER — BUTALBITAL-APAP-CAFFEINE 50-325-40 MG PO TABS: 1.0000 | ORAL_TABLET | Freq: Four times a day (QID) | ORAL | 0 refills | Status: AC | PRN
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
## 2023-11-18 NOTE — ED Provider Notes (Signed)
 [place] EMERGENCY DEPARTMENT AT [facility] Provider Note   CSN: [id number] Arrival date & time: 11/18/23  1228     Patient presents with: Fall and Head Injury   Christina Weber is a 48 y.o. female with past medical history significant for hypertension who presents concern for ongoing headache, neck pain after head injury with no loss of consciousness on 10/2.  [facility] was seen at [facility] [address], they reported that she likely had a concussion, postconcussive syndrome.  She has been taking ibuprofen , Tylenol  but reports that she has had ongoing headache, migraine, light sensitivity, neck pain despite taking medications as above.  She denies any new numbness, tingling, double vision.     Fall  Head Injury      Prior to Admission medications   Medication Sig Start Date End Date Taking? Authorizing Provider  butalbital-acetaminophen -caffeine (FIORICET) 50-325-40 MG tablet Take 1 tablet by mouth every 6 (six) hours as needed for headache. 11/18/23 11/17/24 Yes Desani Sprung H, PA-C  AZO-CRANBERRY PO Take by mouth daily.    [provider]  metoprolol  succinate (TOPROL -XL) 50 MG 24 hr tablet Take 0.5 tablets (25 mg total) by mouth daily. 06/25/21   Lorren Greig PARAS, NP    Allergies: Latex    Review of Systems  All other systems reviewed and are negative.   Updated Vital Signs BP (!) 126/99 (BP Location: Right Arm)   Pulse 65   Temp 98.2 F (36.8 C) (Oral)   Resp 18   Ht 5' 2.5 (1.588 m)   Wt 54.4 kg   SpO2 100%   BMI 21.59 kg/m   Physical Exam Vitals and nursing note reviewed.  Constitutional:      General: She is not in acute distress.    Appearance: Normal appearance.  HENT:     Head: Normocephalic and atraumatic.  Eyes:     General:        Right eye: No discharge.        Left eye: No discharge.  Cardiovascular:     Rate and Rhythm: Normal rate and regular rhythm.     Heart sounds: No murmur heard.    No friction rub. No  gallop.  Pulmonary:     Effort: Pulmonary effort is normal.     Breath sounds: Normal breath sounds.  Abdominal:     General: Bowel sounds are normal.     Palpations: Abdomen is soft.  Musculoskeletal:     Comments: Moving all 4 limbs spontaneously  Skin:    General: Skin is warm and dry.     Capillary Refill: Capillary refill takes less than 2 seconds.  Neurological:     Mental Status: She is alert and oriented to person, place, and time.     Comments: Moves all 4 limbs spontaneously, CN II through XII grossly intact, can ambulate without difficulty, intact sensation throughout.  Psychiatric:        Mood and Affect: Mood normal.        Behavior: Behavior normal.     Comments: Tearful, anxious     (all labs ordered are listed, but only abnormal results are displayed) Labs Reviewed - No data to display  EKG: None  Radiology: CT Head Wo Contrast Result Date: 11/18/2023 CLINICAL DATA:  Provided history: Polytrauma, blunt Fall 1 week ago. EXAM: CT HEAD WITHOUT CONTRAST TECHNIQUE: Contiguous axial images were obtained from the base of the skull through the vertex without intravenous contrast. RADIATION DOSE REDUCTION: This exam  was performed according to the departmental dose-optimization program which includes automated exposure control, adjustment of the mA and/or kV according to patient size and/or use of iterative reconstruction technique. COMPARISON:  None Available. FINDINGS: Brain: No intracranial hemorrhage, mass effect, or midline shift. No hydrocephalus. The basilar cisterns are patent. No evidence of territorial infarct or acute ischemia. No extra-axial or intracranial fluid collection. Vascular: No hyperdense vessel or unexpected calcification. Skull: No fracture or focal lesion. Sinuses/Orbits: Paranasal sinuses and mastoid air cells are clear. The visualized orbits are unremarkable. Other: Question small right frontal scalp hematoma. IMPRESSION: 1. No acute intracranial  abnormality. No skull fracture. 2. Question small right frontal scalp hematoma. Electronically Signed   By: Andrea Gasman M.D.   On: 11/18/2023 15:02     Procedures   Medications Ordered in the ED - No data to display                                  Medical Decision Making  This patient is a 48 y.o. female who presents to the ED for concern of head injury, headache.   Differential diagnoses prior to evaluation: epidural hematoma, subdural hematoma, skull fracture, subarachnoid hemorrhage, unstable cervical spine fracture, concussion vs other MSK injury, Stroke, increased ICP, meningitis, CVA, intracranial tumor, venous sinus thrombosis, migraine, cluster headache, hypertension, drug related, head injury, tension headache, sinusitis, dental abscess, otitis media, TMJ --overall high clinical suspicion for postconcussive syndrome, migraine  Past Medical History / Social History / Additional history: Chart reviewed. Pertinent results include: Overall noncontributory  Physical Exam: Physical exam performed. The pertinent findings include: Patient quite tearful, anxious due to projected wait in the emergency department, we do very brief neuro and physical exam but she declined any more extensive evaluation due to her long wait.  Overall with no focal neurologic deficits, and stable vital signs other than some diastolic hypertension on reassessment, blood pressure 126/99.  Medications / Treatment: Encouraged continued ibuprofen , Tylenol , offered Fioricet for breakthrough headaches, and difficulty sleeping in context of migraines, postconcussive syndrome.  Offered additional treatment in the emergency department which patient declined.   Disposition: After consideration of the diagnostic results and the patients response to treatment, I feel that patient stable for discharge, CT head without contrast independently interpreted by myself shows mild frontal scalp contusion, no intracranial  injury.  Her symptoms are consistent with concussion/postconcussive syndrome.  Stable for discharge with plan as above.  emergency department workup does not suggest an emergent condition requiring admission or immediate intervention beyond what has been performed at this time. The plan is: as above. The patient is safe for discharge and has been instructed to return immediately for worsening symptoms, change in symptoms or any other concerns.   Final diagnoses:  Post concussion syndrome    ED Discharge Orders          Ordered    butalbital-acetaminophen -caffeine (FIORICET) 50-325-40 MG tablet  Every 6 hours PRN        11/18/23 2036               Edana Aguado H, PA-C 11/18/23 2041    Francesca Elsie CROME, MD 11/19/23 2149

## 2023-11-18 NOTE — Discharge Instructions (Addendum)
 You can use the medication I prescribed Fioricet if you are having headaches especially at nighttime, can make you somewhat drowsy but should help with sleep and ongoing migraine.  Your workup today was reassuring, I do feel like you are having migraines related to postconcussive syndrome, they should continue to improve with time but can last for 1 to 2 weeks and sometimes even longer depending on the person.  Please return if you have severe worsening headache, vision changes, numbness, tingling.
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
## 2024-01-10 ENCOUNTER — Other Ambulatory Visit: Payer: Self-pay | Admitting: Family

## 2024-01-10 DIAGNOSIS — Z1231 Encounter for screening mammogram for malignant neoplasm of breast: Secondary | ICD-10-CM

## 2024-02-07 ENCOUNTER — Ambulatory Visit: Admission: RE | Admit: 2024-02-07 | Discharge: 2024-02-07 | Disposition: A | Source: Ambulatory Visit

## 2024-02-07 DIAGNOSIS — Z1231 Encounter for screening mammogram for malignant neoplasm of breast: Secondary | ICD-10-CM

## 2024-02-11 ENCOUNTER — Ambulatory Visit: Payer: Self-pay | Admitting: Family

## 2024-03-10 ENCOUNTER — Encounter
